# Patient Record
Sex: Female | Born: 1955 | Race: White | Hispanic: No | Marital: Married | Smoking: Current every day smoker
Health system: Southern US, Community
[De-identification: ages and names within clinical notes are randomized; demographics above are authoritative.]

## PROBLEM LIST (undated history)

## (undated) DIAGNOSIS — E871 Hypo-osmolality and hyponatremia: Secondary | ICD-10-CM

## (undated) DIAGNOSIS — E46 Unspecified protein-calorie malnutrition: Secondary | ICD-10-CM

## (undated) DIAGNOSIS — J449 Chronic obstructive pulmonary disease, unspecified: Secondary | ICD-10-CM

## (undated) DIAGNOSIS — G8929 Other chronic pain: Secondary | ICD-10-CM

## (undated) DIAGNOSIS — F419 Anxiety disorder, unspecified: Secondary | ICD-10-CM

## (undated) DIAGNOSIS — Z720 Tobacco use: Secondary | ICD-10-CM

## (undated) DIAGNOSIS — J45909 Unspecified asthma, uncomplicated: Secondary | ICD-10-CM

## (undated) DIAGNOSIS — J9610 Chronic respiratory failure, unspecified whether with hypoxia or hypercapnia: Secondary | ICD-10-CM

## (undated) DIAGNOSIS — M549 Dorsalgia, unspecified: Secondary | ICD-10-CM

## (undated) DIAGNOSIS — R042 Hemoptysis: Secondary | ICD-10-CM

## (undated) DIAGNOSIS — E039 Hypothyroidism, unspecified: Secondary | ICD-10-CM

## (undated) HISTORY — DX: Chronic obstructive pulmonary disease, unspecified: J44.9

## (undated) HISTORY — DX: Unspecified asthma, uncomplicated: J45.909

## (undated) HISTORY — DX: Tobacco use: Z72.0

## (undated) HISTORY — DX: Hypo-osmolality and hyponatremia: E87.1

## (undated) HISTORY — DX: Hypothyroidism, unspecified: E03.9

---

## 2007-07-13 ENCOUNTER — Inpatient Hospital Stay (HOSPITAL_COMMUNITY): Admission: EM | Admit: 2007-07-13 | Discharge: 2007-07-18 | Payer: Self-pay | Admitting: Emergency Medicine

## 2007-07-13 ENCOUNTER — Ambulatory Visit: Payer: Self-pay | Admitting: Internal Medicine

## 2007-07-13 ENCOUNTER — Ambulatory Visit: Payer: Self-pay | Admitting: Cardiology

## 2007-07-17 ENCOUNTER — Encounter: Payer: Self-pay | Admitting: Internal Medicine

## 2007-08-12 ENCOUNTER — Ambulatory Visit: Payer: Self-pay | Admitting: Internal Medicine

## 2007-08-12 DIAGNOSIS — F17200 Nicotine dependence, unspecified, uncomplicated: Secondary | ICD-10-CM

## 2007-08-12 DIAGNOSIS — E039 Hypothyroidism, unspecified: Secondary | ICD-10-CM

## 2007-08-12 DIAGNOSIS — I2510 Atherosclerotic heart disease of native coronary artery without angina pectoris: Secondary | ICD-10-CM

## 2007-08-12 DIAGNOSIS — D708 Other neutropenia: Secondary | ICD-10-CM

## 2007-08-12 DIAGNOSIS — J4489 Other specified chronic obstructive pulmonary disease: Secondary | ICD-10-CM | POA: Insufficient documentation

## 2007-08-12 DIAGNOSIS — J449 Chronic obstructive pulmonary disease, unspecified: Secondary | ICD-10-CM

## 2007-08-12 DIAGNOSIS — J45909 Unspecified asthma, uncomplicated: Secondary | ICD-10-CM | POA: Insufficient documentation

## 2007-08-12 LAB — CONVERTED CEMR LAB
BUN: 4 mg/dL — ABNORMAL LOW (ref 6–23)
Basophils Absolute: 0 10*3/uL (ref 0.0–0.1)
Basophils Relative: 0.6 % (ref 0.0–1.0)
CO2: 31 meq/L (ref 19–32)
Calcium: 9.4 mg/dL (ref 8.4–10.5)
Chloride: 99 meq/L (ref 96–112)
Creatinine, Ser: 0.6 mg/dL (ref 0.4–1.2)
Eosinophils Absolute: 0.2 10*3/uL (ref 0.0–0.6)
Eosinophils Relative: 3.2 % (ref 0.0–5.0)
GFR calc Af Amer: 137 mL/min
GFR calc non Af Amer: 113 mL/min
Glucose, Bld: 113 mg/dL — ABNORMAL HIGH (ref 70–99)
HCT: 39.8 % (ref 36.0–46.0)
Hemoglobin: 14.2 g/dL (ref 12.0–15.0)
Lymphocytes Relative: 25.3 % (ref 12.0–46.0)
MCHC: 35.6 g/dL (ref 30.0–36.0)
MCV: 93.9 fL (ref 78.0–100.0)
Monocytes Absolute: 0.5 10*3/uL (ref 0.2–0.7)
Monocytes Relative: 9.6 % (ref 3.0–11.0)
Neutro Abs: 2.8 10*3/uL (ref 1.4–7.7)
Neutrophils Relative %: 61.3 % (ref 43.0–77.0)
Platelets: 302 10*3/uL (ref 150–400)
Potassium: 5.1 meq/L (ref 3.5–5.1)
RBC: 4.24 M/uL (ref 3.87–5.11)
RDW: 12.4 % (ref 11.5–14.6)
Sodium: 137 meq/L (ref 135–145)
WBC: 4.7 10*3/uL (ref 4.5–10.5)

## 2007-08-13 ENCOUNTER — Telehealth: Payer: Self-pay | Admitting: Internal Medicine

## 2009-12-22 ENCOUNTER — Emergency Department (HOSPITAL_COMMUNITY): Admission: EM | Admit: 2009-12-22 | Discharge: 2009-12-22 | Payer: Self-pay | Admitting: Emergency Medicine

## 2009-12-29 ENCOUNTER — Telehealth: Payer: Self-pay | Admitting: Internal Medicine

## 2010-01-07 ENCOUNTER — Encounter: Payer: Self-pay | Admitting: Internal Medicine

## 2010-04-07 ENCOUNTER — Other Ambulatory Visit: Admission: RE | Admit: 2010-04-07 | Discharge: 2010-04-07 | Payer: Self-pay | Admitting: Otolaryngology

## 2010-04-13 ENCOUNTER — Encounter: Admission: RE | Admit: 2010-04-13 | Discharge: 2010-04-13 | Payer: Self-pay | Admitting: Otolaryngology

## 2010-05-13 ENCOUNTER — Ambulatory Visit (HOSPITAL_COMMUNITY): Admission: RE | Admit: 2010-05-13 | Discharge: 2010-05-14 | Payer: Self-pay | Admitting: Otolaryngology

## 2010-05-13 ENCOUNTER — Encounter (INDEPENDENT_AMBULATORY_CARE_PROVIDER_SITE_OTHER): Payer: Self-pay | Admitting: Otolaryngology

## 2010-08-30 NOTE — Progress Notes (Signed)
Summary: ? fu needed  Phone Note From Other Clinic   Summary of Call: not seen her in 2.5 years. GOt leet from [place] that she went there 12/22/2009 with cough. Pls call and see if she wants to come in and see me Initial call taken by: Kalman Shan MD,  December 29, 2009 5:52 PM  Follow-up for Phone Call        [facility]. Carron Curie CMA  December 30, 2009 3:45 PM ATC pt,pt answered at cell number but connection was bad. I tried to call back and there was no answer.  I looked pt up in Echart and the numbers are the same, but there is a local address for the patient. The address we have in EMR is an out of state address, so I will mail a letter to pt at [address] [place], [place] [address] asking her to call the office to set an appt. Carron Curie CMA  January 07, 2010 3:14 PM

## 2010-08-30 NOTE — Letter (Signed)
Summary: [facility]  [address]   [place], [place] [address]   Phone: [phone]  Fax: [phone]    01/07/2010  [facility] [address] [place], [place] [address]  Dear Ms. MOGLE,    We have been attempting to contact you to schedule an appointment to see Dr. Marchelle Gearing. We have been unable to reach you. Please call our office at your earliest convenience. Thank you.         Sincerely,   Nature conservation officer Pulmonary Division

## 2010-10-13 LAB — SURGICAL PCR SCREEN
MRSA, PCR: NEGATIVE
Staphylococcus aureus: NEGATIVE

## 2010-10-13 LAB — CBC
HCT: 41.6 % (ref 36.0–46.0)
Hemoglobin: 15 g/dL (ref 12.0–15.0)
MCH: 33.8 pg (ref 26.0–34.0)
MCHC: 36.1 g/dL — ABNORMAL HIGH (ref 30.0–36.0)
MCV: 93.7 fL (ref 78.0–100.0)
Platelets: 252 10*3/uL (ref 150–400)
RBC: 4.44 MIL/uL (ref 3.87–5.11)
RDW: 12.2 % (ref 11.5–15.5)
WBC: 6.8 10*3/uL (ref 4.0–10.5)

## 2010-10-13 LAB — BASIC METABOLIC PANEL
BUN: 4 mg/dL — ABNORMAL LOW (ref 6–23)
CO2: 29 mEq/L (ref 19–32)
Calcium: 9.5 mg/dL (ref 8.4–10.5)
Chloride: 94 mEq/L — ABNORMAL LOW (ref 96–112)
Creatinine, Ser: 0.55 mg/dL (ref 0.4–1.2)
GFR calc Af Amer: >60 mL/min (ref >60)
GFR calc non Af Amer: >60 mL/min (ref >60)
Glucose, Bld: 94 mg/dL (ref 70–99)
Potassium: 4.1 mEq/L (ref 3.5–5.1)
Sodium: 132 mEq/L — ABNORMAL LOW (ref 135–145)

## 2010-10-17 LAB — SAMPLE TO BLOOD BANK

## 2010-10-17 LAB — DIFFERENTIAL
Basophils Absolute: 0.1 10*3/uL (ref 0.0–0.1)
Basophils Relative: 1 % (ref 0–1)
Eosinophils Absolute: 0.1 10*3/uL (ref 0.0–0.7)
Eosinophils Relative: 2 % (ref 0–5)
Lymphocytes Relative: 14 % (ref 12–46)
Lymphs Abs: 1.2 10*3/uL (ref 0.7–4.0)
Monocytes Absolute: 0.7 10*3/uL (ref 0.1–1.0)
Monocytes Relative: 8 % (ref 3–12)
Neutro Abs: 6.9 10*3/uL (ref 1.7–7.7)
Neutrophils Relative %: 77 % (ref 43–77)

## 2010-10-17 LAB — CBC
HCT: 39.1 % (ref 36.0–46.0)
Hemoglobin: 13.5 g/dL (ref 12.0–15.0)
MCHC: 34.5 g/dL (ref 30.0–36.0)
MCV: 97.4 fL (ref 78.0–100.0)
Platelets: 397 10*3/uL (ref 150–400)
RBC: 4.01 MIL/uL (ref 3.87–5.11)
RDW: 13.7 % (ref 11.5–15.5)
WBC: 9 10*3/uL (ref 4.0–10.5)

## 2010-10-17 LAB — COMPREHENSIVE METABOLIC PANEL
ALT: 20 U/L (ref 0–35)
AST: 31 U/L (ref 0–37)
Albumin: 3.9 g/dL (ref 3.5–5.2)
Alkaline Phosphatase: 97 U/L (ref 39–117)
BUN: 4 mg/dL — ABNORMAL LOW (ref 6–23)
CO2: 29 mEq/L (ref 19–32)
Calcium: 9 mg/dL (ref 8.4–10.5)
Chloride: 93 mEq/L — ABNORMAL LOW (ref 96–112)
Creatinine, Ser: 0.52 mg/dL (ref 0.4–1.2)
GFR calc Af Amer: >60 mL/min (ref >60)
GFR calc non Af Amer: >60 mL/min (ref >60)
Glucose, Bld: 109 mg/dL — ABNORMAL HIGH (ref 70–99)
Potassium: 3.7 mEq/L (ref 3.5–5.1)
Sodium: 131 mEq/L — ABNORMAL LOW (ref 135–145)
Total Bilirubin: 0.5 mg/dL (ref 0.3–1.2)
Total Protein: 7.6 g/dL (ref 6.0–8.3)

## 2010-10-17 LAB — URINALYSIS, ROUTINE W REFLEX MICROSCOPIC
Bilirubin Urine: NEGATIVE
Glucose, UA: NEGATIVE mg/dL
Hgb urine dipstick: NEGATIVE
Ketones, ur: NEGATIVE mg/dL
Nitrite: NEGATIVE
Protein, ur: NEGATIVE mg/dL
Specific Gravity, Urine: 1.003 — ABNORMAL LOW (ref 1.005–1.030)
Urobilinogen, UA: 0.2 mg/dL (ref 0.0–1.0)
pH: 7 (ref 5.0–8.0)

## 2010-10-17 LAB — PROTIME-INR
INR: 0.96 (ref 0.00–1.49)
Prothrombin Time: 12.7 seconds (ref 11.6–15.2)

## 2010-10-17 LAB — APTT: aPTT: 28 seconds (ref 24–37)

## 2010-12-13 NOTE — H&P (Signed)
Terri Stephens, Terri Stephens               ACCOUNT NO.:  [id number]   MEDICAL RECORD NO.:  [id number]          PATIENT TYPE:  EMS   LOCATION:  ED                           FACILITY[facility]  [facility]   PHYSICIAN:  Thomasenia Bottoms, MDDATE OF BIRTH:  [birth year]   DATE OF ADMISSION:  07/13/2007  DATE OF DISCHARGE:                              HISTORY & PHYSICAL   CHIEF COMPLAINT:  Shortness of breath.   HISTORY OF PRESENT ILLNESS:  Terri Stephens is a 55 year old who presents  today with worsening shortness of breath over the last three days.  The  patient is a visitor here from [place].  She says earlier in the week  starting Monday she had some sinus trouble with headache and runny nose,  a little sore throat.  She has been having intermittent low fevers at  home.  Initially was just upper respiratory symptoms but about three  days ago she started having trouble with shortness of breath and that  has progressively gotten worse.  It is to the point where she could not  breathe at all.  The patient did have runny nose and some productive  cough about four days ago, but in the last 48 hours she has been  coughing, but nothing has been coming up.  The patient was diagnosed  with asthma three years ago, but has never been hospitalized for  wheezing or shortness of breath.   PAST MEDICAL HISTORY:  Significant for asthma and hypothyroidism.  Of  note, with the asthma she uses her albuterol inhaler multiple times  every day and gets quite short of breath without it.   SOCIAL HISTORY:  She does smoke cigarettes.  No alcohol or illicit  drugs.  She is unemployed at this time. 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   FAMILY HISTORY:  Significant for mother who died of emphysema and father  who had lung cancer.   PAST SURGICAL HISTORY:  The patient's only surgery is tonsillectomy, and  she has had no hospitalizations in her adult life.   MEDICATIONS:  Medications on arrival include Levoxyl 0.88 mg p.o. daily,  ProAir inhaler which she uses four times a day, Zoloft 100 mg daily in  the winter and 50 mg daily in the summer, Claritin D once daily.   REVIEW OF SYSTEMS:  CONSTITUTIONAL:  No weight loss.  No night sweats.  HEENT:  She has been having headaches the last week along with her runny  nose and sore throat.  Please see HPI for further details, no ringing in  her ears or difficulty swallowing. CARDIOVASCULAR:  She has a lot of  chest tightness and has had that for the last 48 hours, but no trouble  with chest pain exertional or otherwise prior to her episode of  wheezing.  No trouble with lower extremity edema.  RESPIRATORY:  Please  see HPI.  She has not had any hemoptysis.  She does have the regular  wheezing, however.  GI:  No diarrhea, constipation.  Has not vomited  blood or seen any blood in her stool.  GU:  No hematuria.  GYN:  The  patient is postmenopausal.  MUSCULOSKELETAL:  She denies any joint  pains.  INTEGUMENTARY:  She denies any rashes or open lesions.  HEMATOLOGIC:  She denies any trouble bruising easily.  NEUROLOGIC:  No  paresthesias.  No history of seizures.  No asymmetric weakness.  All  other systems reviewed and are negative.   PHYSICAL EXAMINATION:  VITAL SIGNS:  Her vitals on arrival her blood  pressure was 212/98, later without intervention it was 125/76,  temperature 97.4, pulse initially 116, respiratory rate 40, O2 sat 89%  on room air.  GENERAL:  The patient is tachypneic and thin with a somewhat barrel  chest.  HEENT:  Normocephalic, atraumatic.  Pupils are equal, round, and briskly  reactive to light direct and consensual, sclerae nonicteric.  Oral  mucosa and oropharynx moist.  NECK:  No lymphadenopathy, no thyromegaly, no jugular venous distention.  CARDIAC:  Tachycardiac but regular.  No murmurs are appreciated.  LUNGS:  Reveal very poor air movement.  She has an end inspiratory  squeak, no rales.  ABDOMEN:  Soft, nontender, nondistended.  Normoactive  bowel sounds.  No  masses are appreciated.  EXTREMITIES:  Reveal no evidence of clubbing, cyanosis or edema.  She  has palpable DP pulses bilaterally.  NEUROLOGICAL:  She is alert and  oriented x3.  She is appropriate.  Her cranial nerves II-XII are intact  grossly.  She has 5/5 strength in her upper and lower extremities.  Her  sensory exam is intact grossly in her upper and lower extremities.  She  has normal muscle tone and bulk joints reveal no evidence of effusion of  her joints.  SKIN:  Intact with no open lesions or rashes.   LABORATORY DATA:  White count 6.6, hemoglobin 16.6, hematocrit 47.3,  platelet count 258.  Sodium is 128, potassium 4.2, chloride 92, bicarb  26, glucose 112, BUN 4, creatinine 0.48.  Troponin less than 0.05.   Her chest x-ray reveals severe hyperinflation but no evidence of  pneumonia.   ASSESSMENT/PLAN:  1. Chronic obstructive pulmonary disease flare.  This patient says she      has asthma, but given the recent diagnosis of the wheezing in her      smoking history I suspect this is chronic obstructive pulmonary      disease rather than asthma.  It also appears that the patient      requires her inhaler every day.  She never has days where she is      not wheezing or short of breath without the inhaler.  Given her age      under 44 and the history of chronic obstructive pulmonary disease      in her family, I will go ahead and check an alpha-1 antitrypsin;      otherwise, will treat her with intravenous Solu-Medrol and      antibiotics and O2 as the patient was mildly hypoxemic on arrival.  2. Hyponatremia.  Will put the patient on intravenous fluids and      follow.  3. Elevated hemoglobin.  We will monitor this after she has been on      fluids to be sure that the patient does not have polycythemia vera.      She did not have any clubbing on physical examination, but I did      encourage the patient to quit.  4. Tobacco abuse.  The patient has  already had smoking cessation  counseling here in the emergency department, but she should have      this recurrently especially given the history of lung cancer in her      father and mother who died of emphysema.  5. Hypothyroidism.  We will continue her Levoxyl and for her      depression, we will continue her Zoloft.  We will check a thyroid-      stimulating hormone while she is here as well.      Thomasenia Bottoms, MD  Electronically Signed     CVC/MEDQ  D:  07/13/2007  T:  07/15/2007  Job:  [id number]

## 2010-12-13 NOTE — Consult Note (Signed)
Terri Stephens               ACCOUNT NO.:  [id number]   MEDICAL RECORD NO.:  [id number]          PATIENT TYPE:  INP   LOCATION:  1539                         FACILITY:  [facility]   PHYSICIAN:  Kalman Shan, MD   DATE OF BIRTH:  Jun 03, 1956   DATE OF CONSULTATION:  DATE OF DISCHARGE:                                 CONSULTATION   REQUESTING PHYSICIAN:  [facility]   REASON FOR CONSULTATION:  1. Chronic obstructive pulmonary disease exacerbation, evaluation.  2. 40 minute consult beginning at 4:30 p.m. on July 16, 2007.   CHIEF COMPLAINT:  COPD exacerbation.   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  She was visiting her father in [place] for the Thanksgiving  vacations.  She came here around June 10, 2007.  She states that  since moving down from [place] in terms of actually shortness of breath,  she was actually feeling better.  For the past 2-3 weeks prior to  admission on July 13, 2007, she was helping her sister-in-law who  had significant cockroach problems in her house.  The patient was  spraying a lot of RAID every night for at least 2 weeks, and then after  a 1 week break she most recently used it on July 07, 2007.  On te  same day she also was exposed to some sick kids, who were her nieces and  nephews.  Then on July 09, 2007, she started developing a cold and  started feeling unwell.  By July 10, 2007, she thought she was  getting bronchitis and developed cough and chest tightness.  Over the  course of the next few days, symptoms worsened to the extent that  July 13, 2007 she drove herself down to the ER.  She stated that she  was so significantly short of breath that she thought she was going to  die.  She was then triaged to the floor and was treated with  antibiotics, steroids, oxygen, and nebulizers.  Was not admitted to the  intensive care unit.  Did not receive BiPAP, was not intubated.  Since  admission on July 13, 2007 she now feels significantly better.  Shortness of breath is much improved.  She feels that she is 90% better  since admission, but still feels that she is 30% away from her baseline  status prior to November 2008.   PAST MEDICAL HISTORY:  Significant for:  1. Asthma.  This was diagnosed 5 years ago by her primary care      physician in [place], [place].  She has been given ProAir      inhalers.  Over the course for the past few years her ProAir      inhaler requirement has increased and she has never figured out      why.  2. Hypothyroidism on replacement.  3. Depression, on Zoloft.   SOCIAL HISTORY:  Originally from [place].  Has lived in [place] for  the last 20 years.  Is married, has no kids.  Her husband is in [place].  He only now knows about the  illness.  He too smokes.  The patient smoked  2-3 packs of cigarettes a day for the last 33 years.  The last cigarette  was July 13, 2007.  She says she is definitely quitting smoking.  She is a retired Agricultural engineer.  She quit working in 16-Dec-2000 when her  mom died.   EXPOSURE HISTORY:  1. Tobacco abuse as described.  2. Wood smoke from wood burning in [place].  High altitude in      [place], [place], and a lot of cold wind.  3. In addition she has remotely used marijuana.  4. Denies any cocaine exposure.   MEDICATIONS:  Home medications include ProAir, Zoloft, and Synthroid  replacement.  Current medications are prednisone, antibiotics,  nebulizers, Spiriva, and Advair, and Xanax.   ALLERGIES:  NO KNOWN ALLERGIES.   REVIEW OF SYSTEMS:  At baseline prior to admission, her effort tolerance  is limited to 25 feet.  Last walk 1 block many years ago.  In addition  there is baseline wheezing, chest tightness, cough, and white sputum  production.  Denies chest pain, paroxysmal nocturnal dyspnea, edema,  hemoptysis, orthopnea.   FAMILY HISTORY:  Mom died of COPD in 12-16-2000.  The patient had to  remove  her from the ventilator.  Dad has COPD and also has lung cancer.  Alpha  1 antitrypsin has not been checked in the family.  Mom also had stroke  and myocardial infarction.  The patient is the only child and has no  children.   PHYSICAL EXAMINATION:  VITAL SIGNS:  Temperature 98.1.  Pulse of 84.  Respiratory rate of 18.  Blood pressure 128/78.  Saturation 94% on 2  liters.  GENERAL:  A thin, tall female, seated comfortably.  CENTRAL NERVOUS SYSTEM:  Alert and oriented x3.  Speech and ambulation  normal.  MOOD:  Talks a lot, but no flight of ideas.  CARDIOVASCULAR:  Normal heart sounds.  Possible ejection systolic murmur  present.  RESPIRATORY:  Trachea central.  Air entry equal.  Prolonged expiration.  Bilateral end-expiratory wheeze.  Some crackles also present.  ABDOMEN:  Scaphoid, soft, no mass present.  Normal bowel sounds.  EXTREMITIES:  No cyanosis, no clubbing, no edema.  MUSCULOSKELETAL:  Joints look normal.  SKIN:  Skin is intact.   LABORATORY DATA:  1. Chest x-ray July 13, 2007 was essentially clear, but shows      presence of severe COPD.  2. Blood gas not available and not done.  3. Hematology, white count on July 13, 2007 was 6.6.  Hemoglobin      16.6.  Platelet count 258.  Interestingly, white count today is      lower to 2.7, unclear why.  4. Chemistries:  Today sodium 136, potassium 3.5, chloride 98.  BUN 7,      creatinine 0.5, bicarb of 34, calcium of 8.9.  5. Thyroid stimulating hormone on July 13, 2007 was normal.  6. Her cardiac enzymes on July 13, 2007 were negative.   ASSESSMENT AND PLAN:  In summary, Terri Stephens is a 4 year old  Caucasian female who presented with clinically severe chronic  obstructive pulmonary disease exacerbation.  She is being treated with  standard treatment of oxygen, nebulizers, steroids, and antibiotics, but  without the need of mechanical ventilation.  She has improved  significantly since admission.   Current issues deal mainly with chronic  obstructive pulmonary disease.  Issues are:  1.  Family history of  chronic obstructive pulmonary disease. 2.  Future treatment for chronic  obstructive pulmonary disease. 3. Tobacco Abuse   PLAN AND RECOMMENDATIONS:  1. I have written out a steroid taper for 2 weeks.  2. Continue current antibiotic therapy of moxifloxacin for at least 10      days in total.  3. Continue Spiriva as written, life long.  4. Continue Advair.  5. Health maintenance, appreciate giving the flu shot.  6. Health maintenance.  Would definitely recommend Pneumovax vaccine.  7. Refer to pulmonary rehabilitation.  8. In view of the ejection systolic murmur get a transthoracic echo.      Of note, pulmonary rehab is indicated even immediately after      discharge and is safe in this situation.  9. Will obtain blood gas and also some baseline spirometry prior to      discharge.  10.Encourage smoking remission   Follow up in clinic with myself as soon as possible.  I will continue to  follow this patient.      Kalman Shan, MD  Electronically Signed     MR/MEDQ  D:  07/16/2007  T:  07/17/2007  Job:  [id number]

## 2010-12-13 NOTE — Discharge Summary (Signed)
NAMESISSI, PADIA               ACCOUNT NO.:  [id number]   MEDICAL RECORD NO.:  [id number]          PATIENT TYPE:  INP   LOCATION:  1539                         FACILITY:  [facility]   PHYSICIAN:  Ladell Pier, M.D.   DATE OF BIRTH:  1956-04-11   DATE OF ADMISSION:  07/13/2007  DATE OF DISCHARGE:  07/19/2007                               DISCHARGE SUMMARY   DISCHARGE DIAGNOSES:  1. Chronic obstructive pulmonary disease exacerbation.  2. Asthma.  3. Hyponatremia.  4. Hypothyroidism.  5. Tobacco use.  6. Question of septal infarct on echocardiogram and also on      electrocardiogram.  7. Low white blood cell count.   DISCHARGE MEDICATIONS:  1. Prednisone taper.  2. Spiriva daily.  3. Advair b.i.d.  4. Avelox 400 mg daily for 10 days.  5. Levoxyl 488 mcg 88.  6. ProAir HFA p.r.n.  7. Zoloft 100 mg daily.  8. Claritin-D q.24h. p.r.n.   FOLLOW-UP APPOINTMENT:  The patient to follow up at [facility],  call Trish for appointment.  The patient also to follow up with  pulmonary.   CONSULTANTS:  Pulmonary.   PROCEDURES:  Pulmonary function tests showing moderate COPD.   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  She was visiting her father in  [place] for the Thanksgiving vacation.  She came here around November  10.  She states that since moving down from [place] in terms of actual  shortness of breath, she was actually feeling better.  For the past of 2-  3 3 weeks prior to admission she was helping her sister-in-law, who has  a significant cockroach problem in her house.  The patient was spraying  a lot of Raid every night for approximately 2 weeks.  Please see the  admission H&P for remainder of history, past medical history, family  history, social history, medicationss, allergies, review of systems per  admission H&P.   PHYSICAL EXAMINATION ON DISCHARGE:  Temperature 97.5, pulse of 53,  respirations 16,  blood pressure 105/51, pulse ox 95% on room air.  HEENT:  Head normocephalic, atraumatic.  Pupils reactive to light.  Throat without erythema.  CARDIOVASCULAR:  Regular rate and rhythm.  LUNGS:  Clear bilaterally.  ABDOMEN:  Positive bowel sounds.  EXTREMITIES:  No edema.   HOSPITAL COURSE:  Problem 1.  CHRONIC OBSTRUCTIVE PULMONARY DISEASE EXACERBATION:  The  patient was admitted to the hospital, placed on IV steroid and  antibiotic.  Over the course of her hospitalization her symptoms  improved.  She requested a pulmonary consult.  [facility] was  consulted, who recommended discharging her on prednisone taper, Avelox  and inhalers.  She will follow up with them outpatient.   Problem 2.  TOBACCO ABUSE:  Counseled on smoking cessation.   Problem 3.  ASTHMA:  As per above.  She is treated for COPD/asthma.   Problem 4.  ABNORMAL ELECTROCARDIOGRAM 2-D ECHOCARDIOGRAM:  She had a 2-  D echo done while inpatient that showed septal wall hypokinesis and EKG  just showed a question of septal  infarct.  The patient will follow up  with [facility] for further workup of these findings.   Problem 5.  DECREASED WHITE BLOOD CELL COUNT:  The patient had one lab  work that the white count was decreased.  She will get a repeat CBC  outpatient.  If it is still elevated, she will need further workup for  the leukopenia as her white count on admission was normal at 6.6.   DISCHARGE LABS:  Cardiac enzymes:  CK 39, MB 2.1, troponin 0.0.  Alpha-1  antitrypsin 171.  Sodium 136, potassium 3.5, chloride 98, CO2 34,  glucose 124, BUN 7, creatinine 0.47, calcium 8.9.  TSH of 0.759.  BNP of  31.  WBC 2.7, hemoglobin 14.3, platelets 234.   Chest x-ray showed severe COPD, no infiltrate.      Ladell Pier, M.D.  Electronically Signed     [place]/MEDQ  D:  07/18/2007  T:  07/19/2007  Job:  [id number]   cc:   [facility]   [facility]

## 2011-05-05 LAB — BLOOD GAS, ARTERIAL
Acid-Base Excess: 7.7 — ABNORMAL HIGH
Bicarbonate: 33 — ABNORMAL HIGH
Drawn by: 232811
FIO2: 0.21
O2 Saturation: 92.1
Patient temperature: 98.6
TCO2: 28.2
pCO2 arterial: 49 — ABNORMAL HIGH
pH, Arterial: 7.443 — ABNORMAL HIGH
pO2, Arterial: 63.9 — ABNORMAL LOW

## 2011-05-05 LAB — BASIC METABOLIC PANEL
BUN: 7
BUN: 7
CO2: 30
CO2: 34 — ABNORMAL HIGH
Calcium: 8.9
Calcium: 9.1
Chloride: 95 — ABNORMAL LOW
Chloride: 98
Creatinine, Ser: 0.43
Creatinine, Ser: 0.47
GFR calc Af Amer: >60
GFR calc Af Amer: >60
GFR calc non Af Amer: >60
GFR calc non Af Amer: >60
Glucose, Bld: 124 — ABNORMAL HIGH
Glucose, Bld: 125 — ABNORMAL HIGH
Potassium: 3.5
Potassium: 4
Sodium: 133 — ABNORMAL LOW
Sodium: 136

## 2011-05-05 LAB — CARDIAC PANEL(CRET KIN+CKTOT+MB+TROPI)
CK, MB: 2.1
Relative Index: INVALID
Total CK: 39
Troponin I: 0.03

## 2011-05-05 LAB — ALPHA-1-ANTITRYPSIN: A-1 Antitrypsin, Ser: 169

## 2011-05-08 LAB — BASIC METABOLIC PANEL
BUN: 4 — ABNORMAL LOW
BUN: 5 — ABNORMAL LOW
CO2: 26
CO2: 30
Calcium: 9.1
Calcium: 9.5
Chloride: 91 — ABNORMAL LOW
Chloride: 92 — ABNORMAL LOW
Creatinine, Ser: 0.48
Creatinine, Ser: 0.51
GFR calc Af Amer: >60
GFR calc Af Amer: >60
GFR calc non Af Amer: >60
GFR calc non Af Amer: >60
Glucose, Bld: 112 — ABNORMAL HIGH
Glucose, Bld: 139 — ABNORMAL HIGH
Potassium: 4.2
Potassium: 4.4
Sodium: 128 — ABNORMAL LOW
Sodium: 128 — ABNORMAL LOW

## 2011-05-08 LAB — CBC
HCT: 44.1
HCT: 47.3 — ABNORMAL HIGH
Hemoglobin: 14.3
Hemoglobin: 16.6 — ABNORMAL HIGH
MCHC: 32.5
MCHC: 35
MCV: 93.9
MCV: 94.8
Platelets: 234
Platelets: 258
RBC: 4.66
RBC: 5.03
RDW: 12.6
RDW: 13.5
WBC: 2.7 — ABNORMAL LOW
WBC: 6.6

## 2011-05-08 LAB — DIFFERENTIAL
Basophils Absolute: 0
Basophils Relative: 0
Eosinophils Absolute: 0 — ABNORMAL LOW
Eosinophils Relative: 1
Lymphocytes Relative: 17
Lymphs Abs: 1.1
Monocytes Absolute: 0.7
Monocytes Relative: 11
Neutro Abs: 4.7
Neutrophils Relative %: 71

## 2011-05-08 LAB — B-NATRIURETIC PEPTIDE (CONVERTED LAB): Pro B Natriuretic peptide (BNP): 31.1

## 2011-05-08 LAB — ALPHA-1 ANTITRYPSIN PHENOTYPE: A-1 Antitrypsin: 171 mg/dL (ref 100–200)

## 2011-05-08 LAB — POCT CARDIAC MARKERS
CKMB, poc: 7.5
Myoglobin, poc: 90
Operator id: 4074
Troponin i, poc: <0.05

## 2011-05-08 LAB — TSH: TSH: 0.759

## 2013-11-18 ENCOUNTER — Other Ambulatory Visit: Payer: Self-pay | Admitting: Physician Assistant

## 2013-11-18 ENCOUNTER — Ambulatory Visit
Admission: RE | Admit: 2013-11-18 | Discharge: 2013-11-18 | Disposition: A | Discharge status: Home / Self Care | Payer: BC Managed Care – HMO | Source: Ambulatory Visit | Attending: Physician Assistant | Admitting: Physician Assistant

## 2013-11-18 DIAGNOSIS — J449 Chronic obstructive pulmonary disease, unspecified: Secondary | ICD-10-CM

## 2013-11-18 DIAGNOSIS — R05 Cough: Secondary | ICD-10-CM

## 2013-11-18 DIAGNOSIS — R059 Cough, unspecified: Secondary | ICD-10-CM

## 2014-09-29 HISTORY — PX: CARDIAC CATHETERIZATION: SHX172

## 2015-05-24 ENCOUNTER — Institutional Professional Consult (permissible substitution): Payer: Self-pay | Admitting: Pulmonary Disease

## 2015-06-03 ENCOUNTER — Encounter: Payer: Self-pay | Admitting: Pulmonary Disease

## 2015-06-03 ENCOUNTER — Inpatient Hospital Stay (HOSPITAL_COMMUNITY): Payer: BLUE CROSS/BLUE SHIELD

## 2015-06-03 ENCOUNTER — Encounter (HOSPITAL_COMMUNITY): Payer: Self-pay | Admitting: *Deleted

## 2015-06-03 ENCOUNTER — Ambulatory Visit (INDEPENDENT_AMBULATORY_CARE_PROVIDER_SITE_OTHER): Payer: BLUE CROSS/BLUE SHIELD | Admitting: Pulmonary Disease

## 2015-06-03 ENCOUNTER — Inpatient Hospital Stay (HOSPITAL_COMMUNITY)
Admission: AD | Admit: 2015-06-03 | Discharge: 2015-06-06 | DRG: 190 | Disposition: A | Discharge status: Home / Self Care | Payer: BLUE CROSS/BLUE SHIELD | Source: Ambulatory Visit | Attending: Pulmonary Disease | Admitting: Pulmonary Disease

## 2015-06-03 DIAGNOSIS — E43 Unspecified severe protein-calorie malnutrition: Secondary | ICD-10-CM | POA: Insufficient documentation

## 2015-06-03 DIAGNOSIS — J441 Chronic obstructive pulmonary disease with (acute) exacerbation: Principal | ICD-10-CM | POA: Diagnosis present

## 2015-06-03 DIAGNOSIS — T17908A Unspecified foreign body in respiratory tract, part unspecified causing other injury, initial encounter: Secondary | ICD-10-CM

## 2015-06-03 DIAGNOSIS — J9621 Acute and chronic respiratory failure with hypoxia: Secondary | ICD-10-CM | POA: Diagnosis present

## 2015-06-03 DIAGNOSIS — F419 Anxiety disorder, unspecified: Secondary | ICD-10-CM | POA: Diagnosis present

## 2015-06-03 DIAGNOSIS — F17210 Nicotine dependence, cigarettes, uncomplicated: Secondary | ICD-10-CM | POA: Diagnosis present

## 2015-06-03 DIAGNOSIS — R042 Hemoptysis: Secondary | ICD-10-CM

## 2015-06-03 DIAGNOSIS — J439 Emphysema, unspecified: Secondary | ICD-10-CM

## 2015-06-03 LAB — CBC WITH DIFFERENTIAL/PLATELET
Basophils Absolute: 0 10*3/uL (ref 0.0–0.1)
Basophils Relative: 0 %
EOS ABS: 0.2 10*3/uL (ref 0.0–0.7)
EOS PCT: 2 %
HCT: 36.1 % (ref 36.0–46.0)
HEMOGLOBIN: 12.1 g/dL (ref 12.0–15.0)
LYMPHS ABS: 3.4 10*3/uL (ref 0.7–4.0)
Lymphocytes Relative: 28 %
MCH: 31.5 pg (ref 26.0–34.0)
MCHC: 33.5 g/dL (ref 30.0–36.0)
MCV: 94 fL (ref 78.0–100.0)
Monocytes Absolute: 0.8 10*3/uL (ref 0.1–1.0)
Monocytes Relative: 6 %
NEUTROS PCT: 64 %
Neutro Abs: 7.6 10*3/uL (ref 1.7–7.7)
PLATELETS: 325 10*3/uL (ref 150–400)
RBC: 3.84 MIL/uL — AB (ref 3.87–5.11)
RDW: 12.8 % (ref 11.5–15.5)
WBC: 11.9 10*3/uL — AB (ref 4.0–10.5)

## 2015-06-03 LAB — COMPREHENSIVE METABOLIC PANEL
ALT: 18 U/L (ref 14–54)
AST: 21 U/L (ref 15–41)
Albumin: 3.9 g/dL (ref 3.5–5.0)
Alkaline Phosphatase: 73 U/L (ref 38–126)
Anion gap: 6 (ref 5–15)
BUN: 9 mg/dL (ref 6–20)
CALCIUM: 9.2 mg/dL (ref 8.9–10.3)
CO2: 37 mmol/L — ABNORMAL HIGH (ref 22–32)
CREATININE: 0.46 mg/dL (ref 0.44–1.00)
Chloride: 91 mmol/L — ABNORMAL LOW (ref 101–111)
GFR calc non Af Amer: >60 mL/min (ref >60)
Glucose, Bld: 123 mg/dL — ABNORMAL HIGH (ref 65–99)
Potassium: 4.1 mmol/L (ref 3.5–5.1)
SODIUM: 134 mmol/L — AB (ref 135–145)
Total Bilirubin: 0.2 mg/dL — ABNORMAL LOW (ref 0.3–1.2)
Total Protein: 7 g/dL (ref 6.5–8.1)

## 2015-06-03 LAB — PROTIME-INR
INR: 0.94 (ref 0.00–1.49)
Prothrombin Time: 12.8 seconds (ref 11.6–15.2)

## 2015-06-03 LAB — APTT: APTT: 29 s (ref 24–37)

## 2015-06-03 LAB — BRAIN NATRIURETIC PEPTIDE: B NATRIURETIC PEPTIDE 5: 57.1 pg/mL (ref 0.0–100.0)

## 2015-06-03 LAB — TSH: TSH: 1.847 u[IU]/mL (ref 0.350–4.500)

## 2015-06-03 MED ORDER — DEXTROSE 5 % IV SOLN
1.0000 g | INTRAVENOUS | Status: DC
  Administered 2015-06-03 – 2015-06-05 (×3): 1 g via INTRAVENOUS
  Filled 2015-06-03 (×4): qty 10

## 2015-06-03 MED ORDER — SODIUM CHLORIDE 0.9 % IJ SOLN
3.0000 mL | Freq: Two times a day (BID) | INTRAMUSCULAR | Status: DC
Start: 2015-06-03 — End: 2015-06-04

## 2015-06-03 MED ORDER — SODIUM CHLORIDE 0.9 % IJ SOLN: 3.0000 mL | INTRAMUSCULAR | Status: DC | PRN

## 2015-06-03 MED ORDER — ACETAMINOPHEN 325 MG PO TABS: 650.0000 mg | ORAL_TABLET | Freq: Four times a day (QID) | ORAL | Status: DC | PRN

## 2015-06-03 MED ORDER — GUAIFENESIN 200 MG PO TABS
400.0000 mg | ORAL_TABLET | Freq: Two times a day (BID) | ORAL | Status: DC
  Administered 2015-06-03 – 2015-06-05 (×4): 400 mg via ORAL
  Filled 2015-06-03 (×8): qty 2

## 2015-06-03 MED ORDER — LEVOTHYROXINE SODIUM 50 MCG PO TABS: 50.0000 ug | ORAL_TABLET | Freq: Every day | ORAL | Status: DC

## 2015-06-03 MED ORDER — ONDANSETRON HCL 4 MG/2ML IJ SOLN
4.0000 mg | Freq: Four times a day (QID) | INTRAMUSCULAR | Status: DC | PRN
  Administered 2015-06-04: 4 mg via INTRAVENOUS
  Filled 2015-06-03: qty 2

## 2015-06-03 MED ORDER — IPRATROPIUM-ALBUTEROL 0.5-2.5 (3) MG/3ML IN SOLN
3.0000 mL | Freq: Four times a day (QID) | RESPIRATORY_TRACT | Status: DC
  Administered 2015-06-03 – 2015-06-04 (×3): 3 mL via RESPIRATORY_TRACT
  Filled 2015-06-03 (×3): qty 3

## 2015-06-03 MED ORDER — SODIUM CHLORIDE 0.9 % IJ SOLN
3.0000 mL | Freq: Two times a day (BID) | INTRAMUSCULAR | Status: DC
Start: 2015-06-03 — End: 2015-06-04
  Administered 2015-06-03 – 2015-06-04 (×2): 3 mL via INTRAVENOUS

## 2015-06-03 MED ORDER — ALBUTEROL SULFATE (2.5 MG/3ML) 0.083% IN NEBU: 2.5000 mg | INHALATION_SOLUTION | Freq: Four times a day (QID) | RESPIRATORY_TRACT | Status: DC

## 2015-06-03 MED ORDER — NICOTINE 21 MG/24HR TD PT24
21.0000 mg | MEDICATED_PATCH | Freq: Every day | TRANSDERMAL | Status: DC
  Administered 2015-06-03 – 2015-06-06 (×4): 21 mg via TRANSDERMAL
  Filled 2015-06-03 (×4): qty 1

## 2015-06-03 MED ORDER — ALBUTEROL SULFATE (2.5 MG/3ML) 0.083% IN NEBU
2.5000 mg | INHALATION_SOLUTION | RESPIRATORY_TRACT | Status: DC | PRN
  Administered 2015-06-04 – 2015-06-06 (×3): 2.5 mg via RESPIRATORY_TRACT
  Filled 2015-06-03 (×3): qty 3

## 2015-06-03 MED ORDER — IPRATROPIUM BROMIDE 0.02 % IN SOLN: 0.5000 mg | Freq: Four times a day (QID) | RESPIRATORY_TRACT | Status: DC

## 2015-06-03 MED ORDER — LEVOTHYROXINE SODIUM 50 MCG PO TABS
50.0000 ug | ORAL_TABLET | Freq: Every day | ORAL | Status: DC
  Administered 2015-06-04 – 2015-06-06 (×3): 50 ug via ORAL
  Filled 2015-06-03 (×3): qty 1

## 2015-06-03 MED ORDER — PANTOPRAZOLE SODIUM 40 MG PO TBEC
40.0000 mg | DELAYED_RELEASE_TABLET | Freq: Every day | ORAL | Status: DC
  Administered 2015-06-03 – 2015-06-06 (×4): 40 mg via ORAL
  Filled 2015-06-03 (×4): qty 1

## 2015-06-03 MED ORDER — SERTRALINE HCL 100 MG PO TABS
100.0000 mg | ORAL_TABLET | Freq: Every day | ORAL | Status: DC
  Administered 2015-06-04 – 2015-06-06 (×3): 100 mg via ORAL
  Filled 2015-06-03 (×4): qty 1

## 2015-06-03 MED ORDER — ACETAMINOPHEN 650 MG RE SUPP: 650.0000 mg | Freq: Four times a day (QID) | RECTAL | Status: DC | PRN

## 2015-06-03 MED ORDER — ONDANSETRON HCL 4 MG PO TABS: 4.0000 mg | ORAL_TABLET | Freq: Four times a day (QID) | ORAL | Status: DC | PRN

## 2015-06-03 MED ORDER — ENSURE ENLIVE PO LIQD
237.0000 mL | Freq: Two times a day (BID) | ORAL | Status: DC
  Administered 2015-06-04: 237 mL via ORAL

## 2015-06-03 MED ORDER — BUDESONIDE 0.25 MG/2ML IN SUSP
0.2500 mg | Freq: Two times a day (BID) | RESPIRATORY_TRACT | Status: DC
  Administered 2015-06-03 – 2015-06-05 (×4): 0.25 mg via RESPIRATORY_TRACT
  Filled 2015-06-03 (×4): qty 2

## 2015-06-03 MED ORDER — ENOXAPARIN SODIUM 40 MG/0.4ML ~~LOC~~ SOLN
40.0000 mg | SUBCUTANEOUS | Status: DC
  Administered 2015-06-03 – 2015-06-05 (×3): 40 mg via SUBCUTANEOUS
  Filled 2015-06-03 (×3): qty 0.4

## 2015-06-03 MED ORDER — DEXTROSE 5 % IV SOLN
500.0000 mg | INTRAVENOUS | Status: DC
  Administered 2015-06-04 – 2015-06-05 (×2): 500 mg via INTRAVENOUS
  Filled 2015-06-03 (×2): qty 500

## 2015-06-03 MED ORDER — ALPRAZOLAM 0.25 MG PO TABS: 0.2500 mg | ORAL_TABLET | Freq: Two times a day (BID) | ORAL | Status: DC | PRN

## 2015-06-03 MED ORDER — ALPRAZOLAM 0.25 MG PO TABS
0.2500 mg | ORAL_TABLET | Freq: Three times a day (TID) | ORAL | Status: DC | PRN
  Administered 2015-06-03 – 2015-06-06 (×5): 0.25 mg via ORAL
  Filled 2015-06-03 (×5): qty 1

## 2015-06-03 MED ORDER — METHYLPREDNISOLONE SODIUM SUCC 125 MG IJ SOLR
80.0000 mg | Freq: Three times a day (TID) | INTRAMUSCULAR | Status: DC
  Administered 2015-06-03 – 2015-06-04 (×2): 80 mg via INTRAVENOUS
  Filled 2015-06-03 (×2): qty 2

## 2015-06-03 MED ORDER — SODIUM CHLORIDE 0.9 % IV SOLN: 250.0000 mL | INTRAVENOUS | Status: DC | PRN

## 2015-06-03 NOTE — Progress Notes (Signed)
Called to assess patient due to EKG that was done for admission. EKG shows ST ELEVATION in multiple leads: LEADS 2, 3,  V3-V6. Patient warm and dry no chest pain or pain anywhere. She states she is fine and had an episode in March in [place]. She had gone to the ER with respiratory distress and upon getting an EKG they sent her for an emergent heart cath that was clear. Her lungs are decreased, on 3L/. Notified E-link of EKG and patient history.

## 2015-06-03 NOTE — Progress Notes (Signed)
eLink Physician-Brief Progress Note Patient Name: Terri Stephens DOB: 03/11/1956 MRN: [id number]   Date of Service  06/03/2015  HPI/Events of Note  Patient requests a Nicotine patch. Has Hx of tobacco abuse.   eICU Interventions  Will order Nicotine patch 21 mg Q day.     Intervention Category Minor Interventions: Routine modifications to care plan (e.g. PRN medications for pain, fever)  Samariyah Cowles Eugene 06/03/2015, 9:54 PM

## 2015-06-03 NOTE — Progress Notes (Signed)
59 year old with very severe COPD. GOLD Stage IV. Has worsening symptoms of dyspnea on exertion, wheezing, cough productive of sputum. She started having hemoptysis in the pulmonary office today and was admitted for further evaluation.  See H&P admit note from today for further details.  Chilton Greathouse MD [facility] Pager [phone] If no answer or after 3pm call: [phone] 06/03/2015, 5:41 PM

## 2015-06-03 NOTE — H&P (Signed)
PULMONARY / CRITICAL CARE MEDICINE   Name: Terri Stephens MRN: [id number] DOB: 07-27-1956    ADMISSION DATE: 06/03/15  CONSULTATION DATE:  06/03/15   CHIEF COMPLAINT: COPD exacerbation, hemoptysis.  INITIAL PRESENTATION: 59 year old with very severe COPD. GOLD Stage IV. Has worsening symptoms of dyspnea on exertion, wheezing, cough productive of sputum. She started having hemoptysis in the pulmonary office today and was admitted for further evaluation.  STUDIES:  CXR 11/3 >>  SIGNIFICANT EVENTS: 11/3 Admit with AECOPD, hemoptysis.  HISTORY OF PRESENT ILLNESS: Terri Stephens is a 56 year old with very severe COPD. GOLD Stage IV. She was seen at [facility] pulmonary in 2008 by Dr. Marchelle Gearing. She was then lost to follow-up since she moved to [place]. She had been maintained on Advair and Spiriva. She had an admission in [place] on 10/07/14 for an acute exacerbation of COPD. She was treated with antibiotics and steroids. She also underwent left heart catheterization for elevated troponins which did not show any coronary artery disease.   Since her discharge she has felt progressively worse with increasing dyspnea on exertion. It has reached a point where she cannot even walk across the room without having to stop. She moved to [facility] to resume pulmonary care as she felt she wasn't getting proper care at [facility]. In office today she was markedly dyspneic with wheezing. She started having cough with blood stained sputum production.  PFTs [07/17/07] FVC 1.66 [46%] FEV1 0.68 [23%] F/F 40 TLC 118% DLCO 47%  PAST MEDICAL HISTORY :   has a past medical history of COPD (chronic obstructive pulmonary disease) (HCC); Asthma; Hypothyroidism; Hyponatremia; and Tobacco abuse.  has no past surgical history on file. Prior to Admission medications   Medication Sig Start Date End Date Taking? Authorizing Provider  albuterol (PROAIR HFA) 108 (90 BASE) MCG/ACT inhaler Inhale 2 puffs into the lungs every 6  (six) hours as needed.    Historical Provider, MD  ALPRAZolam Prudy Feeler) 0.25 MG tablet Take 0.25 mg by mouth 3 (three) times daily as needed. 10/13/14   Historical Provider, MD  aspirin EC 81 MG tablet Take 81 mg by mouth daily.    Historical Provider, MD  Fluticasone-Salmeterol (ADVAIR DISKUS) 250-50 MCG/DOSE AEPB Inhale 1 puff into the lungs 2 (two) times daily.    Historical Provider, MD  guaifenesin (HUMIBID E) 400 MG TABS tablet Take 800 mg by mouth 4 (four) times daily.    Historical Provider, MD  ipratropium-albuterol (DUONEB) 0.5-2.5 (3) MG/3ML SOLN Inhale 3 mLs into the lungs every 6 (six) hours as needed.    Historical Provider, MD  levothyroxine (SYNTHROID, LEVOTHROID) 50 MCG tablet Take 50 mcg by mouth daily.    Historical Provider, MD  Multiple Vitamins-Minerals (MULTIVITAMIN & MINERAL PO) Take 1 tablet by mouth daily.    Historical Provider, MD  sertraline (ZOLOFT) 100 MG tablet Take 100 mg by mouth daily.    Historical Provider, MD  tiotropium (SPIRIVA) 18 MCG inhalation capsule Place 18 mcg into inhaler and inhale daily.    Historical Provider, MD  valACYclovir (VALTREX) 1000 MG tablet Take 1,000 mg by mouth as needed.    Historical Provider, MD   Allergies  Allergen Reactions  . Bee Venom Anaphylaxis  . Oxycodone Hcl Shortness Of Breath, Itching and Swelling    REACTION: All "codones" in any form    FAMILY HISTORY:  has no family status information on file.  SOCIAL HISTORY:  reports that she has been smoking Cigarettes.  She has a 20 pack-year smoking history. She does  not have any smokeless tobacco history on file. She reports that she drinks alcohol.  REVIEW OF SYSTEMS:  Has severe dyspnea on exertion, cough productive of sputum, hemoptysis.  Denies any fever, chills, malaise, weight loss, loss of appetite. Denies any chest pain, palpitations. Denies any nausea, vomiting, diarrhea, constipation. All other review of systems negative.  SUBJECTIVE:   VITAL  SIGNS: Stable   HEMODYNAMICS:   VENTILATOR SETTINGS:   INTAKE / OUTPUT: No intake or output data in the 24 hours ending 06/03/15 1649  PHYSICAL EXAMINATION: Gen.: Pleasant elderly female.Moderate respiratory distress. Neuro: No gross focal deficits. Neck: No JVD, lymphadenopathy, thyromegaly. RS: Distant breath sounds, faint bilateral expiratory wheezes CVS: S1-S2 heard, no murmurs rubs gallops. Abdomen: Soft, positive bowel sounds. Extremities: No edema.  LABS:  CBC No results for input(s): WBC, HGB, HCT, PLT in the last 168 hours. Coag's No results for input(s): APTT, INR in the last 168 hours. BMET No results for input(s): NA, K, CL, CO2, BUN, CREATININE, GLUCOSE in the last 168 hours. Electrolytes No results for input(s): CALCIUM, MG, PHOS in the last 168 hours. Sepsis Markers No results for input(s): LATICACIDVEN, PROCALCITON, O2SATVEN in the last 168 hours. ABG No results for input(s): PHART, PCO2ART, PO2ART in the last 168 hours. Liver Enzymes No results for input(s): AST, ALT, ALKPHOS, BILITOT, ALBUMIN in the last 168 hours. Cardiac Enzymes No results for input(s): TROPONINI, PROBNP in the last 168 hours. Glucose No results for input(s): GLUCAP in the last 168 hours.  Imaging No results found.   ASSESSMENT / PLAN:  PULMONARY OETT A: COPD with acute exacerbation. Hemoptysis P:   Admitted from clinic to [facility] for monitoring. Chest x-ray Check coags Start ceftriaxone and azithromycin. Solu-Medrol IV 125 mg every 8.  GASTROINTESTINAL A:  P:   PO diet  INFECTIOUS A:   Rule out pneumonia  P:   BCx2 Pending UC  Sputum Pending Abx:  Ceftriaxone, start date 06/03/15, day  Azithromycin, start date 06/03/15, day   Follow cultures Check Procalcitonin  ENDOCRINE A:  Hypothyroidism P:   Check TSH. Continue Synthroid  NEUROLOGIC A: Severe anxiety P:   Xanax when necessary   FAMILY  - Updates:  - Inter-disciplinary family  meet or Palliative Care meeting due by:  06/10/15  TODAY'S SUMMARY:   Terri Stephens Pulmonary and Critical Care Pager [phone] If no answer or after 3pm call: [phone] 06/03/2015, 4:49 PM

## 2015-06-03 NOTE — Progress Notes (Signed)
Pt admitted from 3rd floor for telemetry for COPD excaberation. Pt alert and orientated, resp labored but even and symmetrical. O2 stat 100% on 2L, BP stable. Terri Stephens

## 2015-06-04 ENCOUNTER — Inpatient Hospital Stay (HOSPITAL_COMMUNITY): Payer: BLUE CROSS/BLUE SHIELD

## 2015-06-04 LAB — RENAL FUNCTION PANEL
ALBUMIN: 3.6 g/dL (ref 3.5–5.0)
ANION GAP: 7 (ref 5–15)
BUN: 11 mg/dL (ref 6–20)
CALCIUM: 9.1 mg/dL (ref 8.9–10.3)
CO2: 35 mmol/L — ABNORMAL HIGH (ref 22–32)
Chloride: 91 mmol/L — ABNORMAL LOW (ref 101–111)
Creatinine, Ser: 0.43 mg/dL — ABNORMAL LOW (ref 0.44–1.00)
GFR calc Af Amer: >60 mL/min (ref >60)
Glucose, Bld: 150 mg/dL — ABNORMAL HIGH (ref 65–99)
PHOSPHORUS: 4.1 mg/dL (ref 2.5–4.6)
POTASSIUM: 4.8 mmol/L (ref 3.5–5.1)
SODIUM: 133 mmol/L — AB (ref 135–145)

## 2015-06-04 LAB — URINALYSIS, ROUTINE W REFLEX MICROSCOPIC
Bilirubin Urine: NEGATIVE
Glucose, UA: NEGATIVE mg/dL
Hgb urine dipstick: NEGATIVE
KETONES UR: NEGATIVE mg/dL
LEUKOCYTES UA: NEGATIVE
NITRITE: NEGATIVE
Protein, ur: NEGATIVE mg/dL
Specific Gravity, Urine: 1.007 (ref 1.005–1.030)
Urobilinogen, UA: 0.2 mg/dL (ref 0.0–1.0)
pH: 6.5 (ref 5.0–8.0)

## 2015-06-04 LAB — TROPONIN I
Troponin I: <0.03 ng/mL (ref <0.031)
Troponin I: <0.03 ng/mL (ref <0.031)

## 2015-06-04 MED ORDER — ADULT MULTIVITAMIN LIQUID CH
5.0000 mL | Freq: Every day | ORAL | Status: DC
  Administered 2015-06-04 – 2015-06-06 (×3): 5 mL via ORAL
  Filled 2015-06-04 (×3): qty 5

## 2015-06-04 MED ORDER — ARFORMOTEROL TARTRATE 15 MCG/2ML IN NEBU
15.0000 ug | INHALATION_SOLUTION | Freq: Two times a day (BID) | RESPIRATORY_TRACT | Status: DC
  Administered 2015-06-04 – 2015-06-05 (×2): 15 ug via RESPIRATORY_TRACT
  Filled 2015-06-04 (×2): qty 2

## 2015-06-04 MED ORDER — TIOTROPIUM BROMIDE MONOHYDRATE 18 MCG IN CAPS
18.0000 ug | ORAL_CAPSULE | Freq: Every day | RESPIRATORY_TRACT | Status: DC
  Administered 2015-06-05 – 2015-06-06 (×2): 18 ug via RESPIRATORY_TRACT
  Filled 2015-06-04: qty 5

## 2015-06-04 MED ORDER — PRO-STAT SUGAR FREE PO LIQD
30.0000 mL | Freq: Two times a day (BID) | ORAL | Status: DC
  Administered 2015-06-04 – 2015-06-06 (×3): 30 mL via ORAL
  Filled 2015-06-04 (×5): qty 30

## 2015-06-04 MED ORDER — METHYLPREDNISOLONE SODIUM SUCC 125 MG IJ SOLR
60.0000 mg | Freq: Two times a day (BID) | INTRAMUSCULAR | Status: DC
  Administered 2015-06-04 – 2015-06-05 (×2): 60 mg via INTRAVENOUS
  Filled 2015-06-04 (×2): qty 2

## 2015-06-04 MED ORDER — BOOST PLUS PO LIQD
237.0000 mL | Freq: Three times a day (TID) | ORAL | Status: DC
  Administered 2015-06-04 – 2015-06-06 (×4): 237 mL via ORAL
  Filled 2015-06-04 (×7): qty 237

## 2015-06-04 NOTE — Progress Notes (Signed)
PULMONARY / CRITICAL CARE MEDICINE   Name: Terri Stephens MRN: [id number] DOB: 1956-01-10    ADMISSION DATE: 06/03/15  CONSULTATION DATE:  06/03/15   CHIEF COMPLAINT: COPD exacerbation, hemoptysis.  INITIAL PRESENTATION: 59 year old with very severe COPD. GOLD Stage IV. Has worsening symptoms of dyspnea on exertion, wheezing, cough productive of sputum. She started having hemoptysis in the pulmonary office today and was admitted for further evaluation.  STUDIES:  CXR 11/3 >>  SIGNIFICANT EVENTS: 11/3 Admit with AECOPD, hemoptysis.  PFTs [07/17/07] FVC 1.66 [46%] FEV1 0.68 [23%] F/F 40 TLC 118% DLCO 47%  SUBJECTIVE:   VITAL SIGNS: Stable Temp:  [97.7 F (36.5 C)-98 F (36.7 C)] 97.7 F (36.5 C) (11/04 0502) Pulse Rate:  [66-96] 66 (11/04 0502) Resp:  [19-28] 20 (11/04 0502) BP: (105-146)/(59-82) 105/67 mmHg (11/04 0502) SpO2:  [97 %-100 %] 98 % (11/04 0758) Weight:  [52.708 kg (116 lb 3.2 oz)-54.5 kg (120 lb 2.4 oz)] 52.708 kg (116 lb 3.2 oz) (11/04 4098) HEMODYNAMICS:   VENTILATOR SETTINGS:   INTAKE / OUTPUT:  Intake/Output Summary (Last 24 hours) at 06/04/15 1058 Last data filed at 06/04/15 0941  Gross per 24 hour  Intake    893 ml  Output   1200 ml  Net   -307 ml    PHYSICAL EXAMINATION: Gen.: Pleasant elderly female.Moderate respiratory distress. Neuro: No gross focal deficits. Neck: No JVD, lymphadenopathy, thyromegaly. RS: Distant breath sounds, faint bilateral expiratory wheezes, no accessory muscle use  CVS: S1-S2 heard, no murmurs rubs gallops. Abdomen: Soft, positive bowel sounds. Extremities: No edema.  LABS:  CBC  Recent Labs Lab 06/03/15 1956  WBC 11.9*  HGB 12.1  HCT 36.1  PLT 325   Coag's  Recent Labs Lab 06/03/15 1956  APTT 29  INR 0.94   BMET  Recent Labs Lab 06/03/15 1956 06/04/15 0524  NA 134* 133*  K 4.1 4.8  CL 91* 91*  CO2 37* 35*  BUN 9 11  CREATININE 0.46 0.43*  GLUCOSE 123* 150*    Electrolytes  Recent Labs Lab 06/03/15 1956 06/04/15 0524  CALCIUM 9.2 9.1  PHOS  --  4.1   Sepsis Markers No results for input(s): LATICACIDVEN, PROCALCITON, O2SATVEN in the last 168 hours. ABG No results for input(s): PHART, PCO2ART, PO2ART in the last 168 hours. Liver Enzymes  Recent Labs Lab 06/03/15 1956 06/04/15 0524  AST 21  --   ALT 18  --   ALKPHOS 73  --   BILITOT 0.2*  --   ALBUMIN 3.9 3.6   Cardiac Enzymes  Recent Labs Lab 06/04/15 0110 06/04/15 0524  TROPONINI <0.03 <0.03   Glucose No results for input(s): GLUCAP in the last 168 hours.  Imaging X-ray Chest Pa And Lateral  06/03/2015  CLINICAL DATA:  59 year old female with progressive hemoptysis. Clinical history includes chronic COPD and smoking EXAM: CHEST  2 VIEW COMPARISON:  Prior chest x-ray 11/18/2013 FINDINGS: Cardiac and mediastinal contours are unchanged and remain within normal limits. Atherosclerotic calcification present in the transverse aorta. The lungs are markedly hyperinflated with increased anterior clear space and flattening of the diaphragms. Central bronchitic change and a background of emphysema. No focal airspace consolidation, pleural effusion or pneumothorax. No suspicious mass or nodule. Osseous structures intact and unremarkable. IMPRESSION: Stable chest x-ray with pulmonary parenchymal changes consistent with the clinical history of COPD. No findings to explain hemoptysis. Electronically Signed   By: Malachy Moan M.D.   On: 06/03/2015 19:16     ASSESSMENT / PLAN: Chronic  resp failure  COPD with acute exacerbation. Hemoptysis-->improved P:   Admitted from clinic to [facility] for monitoring. cont ceftriaxone and azithromycin; started 11/3>>> Solu-Medrol IV change to 60q12 Ceftriaxone, start date 06/03/15, day 1/x Azithromycin, start date 06/03/15, day 1/x Follow cultures Ck Dg esophagram  FOB at some point after dc  Hypothyroidism P:   F/u TSH. Continue  Synthroid  Severe anxiety P:   Xanax when necessary   FAMILY  - Updates:  - Inter-disciplinary family meet or Palliative Care meeting due by:  06/10/15

## 2015-06-04 NOTE — Progress Notes (Signed)
Initial Nutrition Assessment  DOCUMENTATION CODES:   Underweight, Severe malnutrition in context of chronic illness  INTERVENTION:  - Discontinue Ensure Enlive.  - Provide Boost Plus TID (each supplement contains 360 kcal and 14 gm protein).  - Provide Pro-Stat BID (each supplement contains 100 kcal and 15 gm protein).  - Order Multi-vitamin.  - Encouraged patient to maintain PO intake.  - RD team will continue to monitor for needs.  NUTRITION DIAGNOSIS:   Malnutrition related to chronic illness as evidenced by severe depletion of muscle mass, moderate depletion of body fat, energy intake < 75% for > or equal to 1 month.  GOAL:   Patient will meet greater than or equal to 90% of their needs, Weight gain  MONITOR:   PO intake, Supplement acceptance, Labs, Weight trends  REASON FOR ASSESSMENT:   Malnutrition Screening Tool    ASSESSMENT:   59 year old with very severe COPD. GOLD Stage IV. Has worsening symptoms of dyspnea on exertion, wheezing, cough productive of sputum. She started having hemoptysis in the pulmonary office today and was admitted for further evaluation.  Patient sitting upright in bed eating lunch during visit for MST score. Patient reports a good appetite, and denies any nausea, vomiting, or bowel dysfunction. She has asked the Doctor to be changed from a heart-healthy diet, which is affecting her intake because she does not like the food. She describes having recent swallowing problems and that eating can "strangulate" her, but does not know which foods cause these problems. She states she is afraid of aspirating and this fear is reducing her intake. She is currently being followed by Speech. Per chart, her meal completion is 50%.   Per patient, she reported knowing that she should be eating small, frequent meals throughout the day, but is having trouble meeting her nutritional needs. She has limited energy to cook, and relies on a friend to bring foods to her.  She frequently relies on microwave meals, or frozen foods. She states she usually snacks throughout the day. A usual breakfast is a honey-bun, and a package of NABS for lunch. She does report a history of lactose intolerance, but recently has started eating yogurt. She is concerned that her Ensure Supplement may cause her problems, but is open to trying other supplements. Also reported concerns about cost and how to access these supplements at home with limited transportation. RD team left patient a handout of supplement costs and coupons.   Patient states her weight has been trending downward, and that in March she weighed 122 lbs. This is a 5% weight loss, which is not significant for the time frame. She states she used to weigh 160 lbs before things started getting worse.  NFPE performed - patient has mild/moderate fat wasting - orbital, upper arm; severe muscle wasting - thigh, knee; and mild muscle wasting: temple, scapula, clavicle.   Labs reviewed: low Na (133), low Cl (91), high CO2 (35), low Cr (0.43), high WBC (11.9).   Medications reviewed.   Diet Order:  Diet Heart Room service appropriate?: Yes; Fluid consistency:: Thin  Skin:  Reviewed, no issues  Last BM:  unknown  Height:   Ht Readings from Last 1 Encounters:  06/03/15 5\' 8"  (1.727 m)    Weight:   Wt Readings from Last 1 Encounters:  06/04/15 116 lb 3.2 oz (52.708 kg)    Ideal Body Weight:  63.6 kg  BMI:  Body mass index is 17.67 kg/(m^2).  Estimated Nutritional Needs:   Kcal:  1500-1700  kcal  Protein:  65-75 gm  Fluid:  1.5-1.7 L/day  EDUCATION NEEDS:   No education needs identified at this time  Delano Metz, Dietetic Intern 06/04/2015 12:42 PM

## 2015-06-04 NOTE — Progress Notes (Signed)
eLink Physician-Brief Progress Note Patient Name: Elleah Hemsley DOB: May 29, 1956 MRN: [id number]   Date of Service  06/04/2015  HPI/Events of Note  Contacted by [facility] regarding EKG with ST elevation. EKG has early repolarization without reciprocal changes or q waves. RN reports patient without chest pain. Reportedly had similar presentation & abnormal EKG in March 2016 in [place] with "clean cath". Admitted with hemoptysis & COPD exacerbation. BNP normal. Vitals stable.  eICU Interventions  Troponin I q6hr x3     Intervention Category Intermediate Interventions: Diagnostic test evaluation  Lawanda Cousins 06/04/2015, 12:13 AM

## 2015-06-04 NOTE — Progress Notes (Signed)
eLink Physician-Brief Progress Note Patient Name: Steffanie Mingle DOB: 12-14-1955 MRN: [id number]   Date of Service  06/04/2015  HPI/Events of Note  Trop I <0.03  eICU Interventions  Continue trending Trop I     Intervention Category Intermediate Interventions: Diagnostic test evaluation  Lawanda Cousins 06/04/2015, 2:05 AM

## 2015-06-05 MED ORDER — GUAIFENESIN ER 600 MG PO TB12
1200.0000 mg | ORAL_TABLET | Freq: Two times a day (BID) | ORAL | Status: DC
  Administered 2015-06-05 – 2015-06-06 (×3): 1200 mg via ORAL
  Filled 2015-06-05 (×3): qty 2

## 2015-06-05 MED ORDER — PREDNISONE 5 MG PO TABS
30.0000 mg | ORAL_TABLET | Freq: Every day | ORAL | Status: DC
  Administered 2015-06-05 – 2015-06-06 (×2): 30 mg via ORAL
  Filled 2015-06-05 (×2): qty 1
  Filled 2015-06-05 (×2): qty 2

## 2015-06-05 MED ORDER — BUDESONIDE-FORMOTEROL FUMARATE 160-4.5 MCG/ACT IN AERO
2.0000 | INHALATION_SPRAY | Freq: Two times a day (BID) | RESPIRATORY_TRACT | Status: DC
  Administered 2015-06-05 – 2015-06-06 (×2): 2 via RESPIRATORY_TRACT
  Filled 2015-06-05: qty 6

## 2015-06-05 NOTE — Progress Notes (Signed)
PCCM PROGRESS NOTE  ADMISSION DATE: 06/03/2015  CC: Short of breath, hemoptysis  SUBJECTIVE: Breathing better.  No coughing up blood.  OBJECTIVE: BP 121/61 mmHg  Pulse 62  Temp(Src) 97.7 F (36.5 C) (Oral)  Resp 20  Ht 5\' 8"  (1.727 m)  Wt 116 lb 3.2 oz (52.708 kg)  BMI 17.67 kg/m2  SpO2 95%  I/O last 3 completed shifts: In: 1723 [P.O.:1620; I.V.:3; IV Piggyback:100] Out: 2700 [Urine:2700]  General: thin HEENT: no sinus tenderness Cardiac: regular Chest: decreased BS, no wheeze Abd: soft, non tender Ext: no edema Neuro: normal strength Skin: no rashes   CMP Latest Ref Rng 06/04/2015 06/03/2015 05/09/2010  Glucose 65 - 99 mg/dL 07/09/2010) 517(G) 94  BUN 6 - 20 mg/dL 11 9 4(L)  Creatinine 017(C - 1.00 mg/dL 9.44) 9.67(R 9.16  Sodium 135 - 145 mmol/L 133(L) 134(L) 132(L)  Potassium 3.5 - 5.1 mmol/L 4.8 4.1 4.1  Chloride 101 - 111 mmol/L 91(L) 91(L) 94(L)  CO2 22 - 32 mmol/L 35(H) 37(H) 29  Calcium 8.9 - 10.3 mg/dL 9.1 9.2 9.5  Total Protein 6.5 - 8.1 g/dL - 7.0 -  Total Bilirubin 0.3 - 1.2 mg/dL - 3.84) -  Alkaline Phos 38 - 126 U/L - 73 -  AST 15 - 41 U/L - 21 -  ALT 14 - 54 U/L - 18 -    CBC Latest Ref Rng 06/03/2015 05/09/2010 12/22/2009  WBC 4.0 - 10.5 K/uL 11.9(H) 6.8 9.0  Hemoglobin 12.0 - 15.0 g/dL 12/24/2009 99.3 57.0  Hematocrit 36.0 - 46.0 % 36.1 41.6 39.1  Platelets 150 - 400 K/uL 325 252 397    Lab Results  Component Value Date   TSH 1.847 06/03/2015    BNP    Component Value Date/Time   BNP 57.1 06/03/2015 1956    Cardiac Panel (last 3 results)  Recent Labs  06/04/15 0110 06/04/15 0524  TROPONINI <0.03 <0.03     X-ray Chest Pa And Lateral  06/03/2015  CLINICAL DATA:  59 year old female with progressive hemoptysis. Clinical history includes chronic COPD and smoking EXAM: CHEST  2 VIEW COMPARISON:  Prior chest x-ray 11/18/2013 FINDINGS: Cardiac and mediastinal contours are unchanged and remain within normal limits. Atherosclerotic calcification  present in the transverse aorta. The lungs are markedly hyperinflated with increased anterior clear space and flattening of the diaphragms. Central bronchitic change and a background of emphysema. No focal airspace consolidation, pleural effusion or pneumothorax. No suspicious mass or nodule. Osseous structures intact and unremarkable. IMPRESSION: Stable chest x-ray with pulmonary parenchymal changes consistent with the clinical history of COPD. No findings to explain hemoptysis. Electronically Signed   By: 11/20/2013 M.D.   On: 06/03/2015 19:16   Dg Esophagus  06/04/2015  CLINICAL DATA:  Aspiration.  Severe COPD. EXAM: ESOPHOGRAM / BARIUM SWALLOW / BARIUM TABLET STUDY TECHNIQUE: Combined double contrast and single contrast examination performed using effervescent crystals, thick barium liquid, and thin barium liquid. The patient was observed with fluoroscopy swallowing a 13 mm barium sulphate tablet. FLUOROSCOPY TIME:  Fluoroscopy Time:  1 minutes 0 seconds COMPARISON:  None. FINDINGS: The oropharyngeal swallowing mechanisms are normal. The mucosa and motility of the esophagus are normal. A 13 mm barium tablet passed immediately from the mouth to the stomach with no delay. No appreciable hiatal hernia. IMPRESSION: Normal barium esophagram. No aspiration. No hiatal hernia or esophagitis. Electronically Signed   By: 13/10/2014 M.D.   On: 06/04/2015 15:44    STUDIES: 11/04 Esophogram >> normal  EVENTS:  11/03 Admit 11/04 Chest pain  DISCUSSION: 59 yo female smoker admitted from office with hemoptysis and AECOPD.  She has hx of GOLD 4 COPD, and followed by Dr. Marchelle Gearing.  ASSESSMENT/PLAN:  COPD exacerbation. Hemoptysis. Plan: - Transition to symbicort and d/c pulmicort/brovana - continue spiriva, prn albuterol - d/c ipratropium from nebulizer regimen - day 3 of rocephin, zithromax - change to 30 mg prednisone 11/05 and d/c solumedrol - add flutter valve - change mucinex to 1200 mg  bid  Acute on chronic hypoxic respiratory failure >> uses 2 liters oxygen at home Plan: - oxygen to keep SpO2 90 to 95%  Tobacco abuse. Plan: - smoking cessation - nicotine pach  Hx of hypothyroidism. Plan: - continue synthroid  Anxiety. Plan: - continue zoloft - prn xanax  If stable, then likely d/c home 11/06.  Coralyn Helling, MD [facility] Pulmonary/Critical Care 06/05/2015, 12:26 PM Pager:  [phone] After 3pm call: [phone]

## 2015-06-06 ENCOUNTER — Telehealth: Payer: Self-pay | Admitting: Adult Health

## 2015-06-06 DIAGNOSIS — E43 Unspecified severe protein-calorie malnutrition: Secondary | ICD-10-CM

## 2015-06-06 MED ORDER — ALPRAZOLAM 0.25 MG PO TABS: 0.2500 mg | ORAL_TABLET | Freq: Three times a day (TID) | ORAL | Status: DC | PRN

## 2015-06-06 MED ORDER — PREDNISONE 10 MG PO TABS: ORAL_TABLET | ORAL | Status: DC

## 2015-06-06 MED ORDER — BUDESONIDE-FORMOTEROL FUMARATE 160-4.5 MCG/ACT IN AERO: 2.0000 | INHALATION_SPRAY | Freq: Two times a day (BID) | RESPIRATORY_TRACT | Status: DC

## 2015-06-06 MED ORDER — DOXYCYCLINE HYCLATE 100 MG PO CAPS: ORAL_CAPSULE | ORAL | Status: DC

## 2015-06-06 NOTE — Discharge Summary (Signed)
Physician Discharge Summary  Patient ID: Terri Stephens MRN: [id number] DOB/AGE: April 26, 1956 59 y.o.  Admit date: 06/03/2015 Discharge date: 06/06/2015    Discharge Diagnoses:  Active Problems:   COPD exacerbation (HCC)   Protein-calorie malnutrition, severe   Brief Summary: Terri Stephens is a 78 y.o. y/o female with a PMH of Gold IV COPD who presented 11/3 to the office with worsening dyspnea, wheezing and productive cough.  In the office she developed hemoptysis thought most likely r/t infectious bronchitis and was admitted to [facility] for further eval.  No evidence of mass on CXR although she is at high risk for malignancy.  She was treated for AECOPD with IV abx, IV steroids and bronchodilators.  W/u for hemoptysis was essentially negative and hemoptysis is now resolved.  She is back to her baseline respiratory status and ready for d/c home pending close outpt monitoring.     Consults: None   Lines/tubes: None   Microbiology/Sepsis markers: none  Significant Diagnostic Studies:  PA/Lat CXR 11/3>>> Stable chest x-ray with pulmonary parenchymal changes consistent with the clinical history of COPD. No findings to explain hemoptysis. Esophagram 11/4>>> Normal barium esophagram. No aspiration. No hiatal hernia or esophagitis.                                                                   D/c plan by Discharge Diagnosis COPD exacerbation. Hemoptysis >> resolved. Acute on chronic hypoxic respiratory failure >> uses 2 liters oxygen at home Plan: - continue spiriva, symbicort and prn albuterol - d/c'ed ipratropium from nebulizer regimen - PO doxycycline to complete 7 days total abx  - prednisone taper over next 4 days then stop  - flutter valve prn - mucinex to 1200 mg bid - continuous O2 as prior  - outpt pulmonary f/u in 1 week  - consider outpt bronchoscopy to r/o malignancy given smoking hx and hemoptysis   Tobacco abuse. Plan: - smoking cessation - nicotine  pach  Hx of hypothyroidism. Plan: - continue synthroid  Anxiety. Plan: - continue zoloft - prn xanax  Filed Vitals:   06/05/15 1155 06/05/15 2140 06/06/15 0556 06/06/15 0740  BP:  123/69 120/73   Pulse:  73 60   Temp:  98.2 F (36.8 C) 98.3 F (36.8 C)   TempSrc:  Oral Oral   Resp:  20 20   Height:      Weight:      SpO2: 95% 98% 100% 98%     Discharge Labs  BMET  Recent Labs Lab 06/03/15 1956 06/04/15 0524  NA 134* 133*  K 4.1 4.8  CL 91* 91*  CO2 37* 35*  GLUCOSE 123* 150*  BUN 9 11  CREATININE 0.46 0.43*  CALCIUM 9.2 9.1  PHOS  --  4.1     CBC   Recent Labs Lab 06/03/15 1956  HGB 12.1  HCT 36.1  WBC 11.9*  PLT 325   Anti-Coagulation  Recent Labs Lab 06/03/15 1956  INR 0.94         Medication List    STOP taking these medications        ADVAIR DISKUS 250-50 MCG/DOSE Aepb  Generic drug:  Fluticasone-Salmeterol     ipratropium-albuterol 0.5-2.5 (3) MG/3ML Soln  Commonly known as:  DUONEB  OMEGA 3 PO      TAKE these medications        ALPRAZolam 0.25 MG tablet  Commonly known as:  XANAX  Take 1 tablet (0.25 mg total) by mouth 3 (three) times daily as needed for anxiety.     aspirin EC 81 MG tablet  Take 81 mg by mouth daily.     budesonide-formoterol 160-4.5 MCG/ACT inhaler  Commonly known as:  SYMBICORT  Inhale 2 puffs into the lungs 2 (two) times daily.     doxycycline 100 MG capsule  Commonly known as:  VIBRAMYCIN  1 tablet by mouth twice daily x 3 days     guaifenesin 400 MG Tabs tablet  Commonly known as:  HUMIBID E  Take 800 mg by mouth 3 (three) times daily.     ibuprofen 200 MG tablet  Commonly known as:  ADVIL,MOTRIN  Take 800 mg by mouth 2 (two) times daily as needed for headache or moderate pain.     levothyroxine 50 MCG tablet  Commonly known as:  SYNTHROID, LEVOTHROID  Take 50 mcg by mouth daily.     MULTIVITAMIN & MINERAL PO  Take 1 tablet by mouth daily.     nicotine 14 mg/24hr patch   Commonly known as:  NICODERM CQ - dosed in mg/24 hours  Place 14 mg onto the skin daily.     predniSONE 10 MG tablet  Commonly known as:  DELTASONE  2 tabs po daily x 2 days then 1 tab po daily x 2 days then STOP     PROAIR HFA 108 (90 BASE) MCG/ACT inhaler  Generic drug:  albuterol  Inhale 1-2 puffs into the lungs every 6 (six) hours as needed for wheezing.     sertraline 100 MG tablet  Commonly known as:  ZOLOFT  Take 100 mg by mouth daily.     tiotropium 18 MCG inhalation capsule  Commonly known as:  SPIRIVA  Place 18 mcg into inhaler and inhale daily.     valACYclovir 1000 MG tablet  Commonly known as:  VALTREX  Take 1,000 mg by mouth as needed (outbreaks).          Disposition: Home   Discharged Condition: Katherina Wimer has met maximum benefit of inpatient care and is medically stable and cleared for discharge.  Patient is pending follow up as above.      Time spent on disposition:  Greater than 35 minutes.   SignedNickolas Madrid, NP 06/06/2015  10:17 AM Pager: [phone] or [phone]  Chesley Mires, MD [place] 06/06/2015, 1:06 PM Pager:  [phone] After 3pm call: [phone]

## 2015-06-06 NOTE — Progress Notes (Signed)
Pt able with 2 person assist and use of the steady able to use BSC and void. Post bladder scan results 0

## 2015-06-06 NOTE — Progress Notes (Signed)
Shift progressed uneventfully. Patient slept well overnight and denied any pain. Vital signs remained stable.   

## 2015-06-06 NOTE — Telephone Encounter (Signed)
Needs OV for hospital f/u in 1 week with TP

## 2015-06-06 NOTE — Progress Notes (Signed)
PCCM PROGRESS NOTE  ADMISSION DATE: 06/03/2015  CC: Short of breath, hemoptysis  SUBJECTIVE: Anxious to go home.  OBJECTIVE: BP 120/73 mmHg  Pulse 60  Temp(Src) 98.3 F (36.8 C) (Oral)  Resp 20  Ht 5\' 8"  (1.727 m)  Wt 116 lb 3.2 oz (52.708 kg)  BMI 17.67 kg/m2  SpO2 98%  I/O last 3 completed shifts: In: 830 [P.O.:480; IV Piggyback:350] Out: 2450 [Urine:2450]  General: thin HEENT: no sinus tenderness Cardiac: regular Chest: decreased BS, no wheeze Abd: soft, non tender Ext: no edema Neuro: normal strength Skin: no rashes   CMP Latest Ref Rng 06/04/2015 06/03/2015 05/09/2010  Glucose 65 - 99 mg/dL 07/09/2010) 782(U) 94  BUN 6 - 20 mg/dL 11 9 4(L)  Creatinine 235(T - 1.00 mg/dL 6.14) 4.31(V 4.00  Sodium 135 - 145 mmol/L 133(L) 134(L) 132(L)  Potassium 3.5 - 5.1 mmol/L 4.8 4.1 4.1  Chloride 101 - 111 mmol/L 91(L) 91(L) 94(L)  CO2 22 - 32 mmol/L 35(H) 37(H) 29  Calcium 8.9 - 10.3 mg/dL 9.1 9.2 9.5  Total Protein 6.5 - 8.1 g/dL - 7.0 -  Total Bilirubin 0.3 - 1.2 mg/dL - 8.67) -  Alkaline Phos 38 - 126 U/L - 73 -  AST 15 - 41 U/L - 21 -  ALT 14 - 54 U/L - 18 -    CBC Latest Ref Rng 06/03/2015 05/09/2010 12/22/2009  WBC 4.0 - 10.5 K/uL 11.9(H) 6.8 9.0  Hemoglobin 12.0 - 15.0 g/dL 12/24/2009 50.9 32.6  Hematocrit 36.0 - 46.0 % 36.1 41.6 39.1  Platelets 150 - 400 K/uL 325 252 397    Lab Results  Component Value Date   TSH 1.847 06/03/2015    BNP    Component Value Date/Time   BNP 57.1 06/03/2015 1956    Cardiac Panel (last 3 results)  Recent Labs  06/04/15 0110 06/04/15 0524  TROPONINI <0.03 <0.03     Dg Esophagus  06/04/2015  CLINICAL DATA:  Aspiration.  Severe COPD. EXAM: ESOPHOGRAM / BARIUM SWALLOW / BARIUM TABLET STUDY TECHNIQUE: Combined double contrast and single contrast examination performed using effervescent crystals, thick barium liquid, and thin barium liquid. The patient was observed with fluoroscopy swallowing a 13 mm barium sulphate tablet.  FLUOROSCOPY TIME:  Fluoroscopy Time:  1 minutes 0 seconds COMPARISON:  None. FINDINGS: The oropharyngeal swallowing mechanisms are normal. The mucosa and motility of the esophagus are normal. A 13 mm barium tablet passed immediately from the mouth to the stomach with no delay. No appreciable hiatal hernia. IMPRESSION: Normal barium esophagram. No aspiration. No hiatal hernia or esophagitis. Electronically Signed   By: 13/10/2014 M.D.   On: 06/04/2015 15:44    STUDIES: 11/04 Esophogram >> normal  EVENTS: 11/03 Admit 11/04 Chest pain  DISCUSSION: 59 yo female smoker admitted from office with hemoptysis and AECOPD.  She has hx of GOLD 4 COPD.  ASSESSMENT/PLAN:  COPD exacerbation. Hemoptysis >> resolved. Plan: - continue spiriva, symbicort and prn albuterol - d/c'ed ipratropium from nebulizer regimen - day 4/7 Abx >> change to po doxycycline 100 mg bid - 30 mg prednisone 11/06 >> change to 20 mg on 11/07 and wean off over next 4 days - flutter valve prn - mucinex to 1200 mg bid  Acute on chronic hypoxic respiratory failure >> uses 2 liters oxygen at home Plan: - oxygen to keep SpO2 90 to 95%  Tobacco abuse. Plan: - smoking cessation - nicotine pach  Hx of hypothyroidism. Plan: - continue synthroid  Anxiety. Plan: -  continue zoloft - prn xanax  D/c home today.  She will need follow up with Dr. Isaiah Serge or Tammy Parrett within 7 days.  Coralyn Helling, MD [facility] Pulmonary/Critical Care 06/06/2015, 9:34 AM Pager:  [phone] After 3pm call: [phone]

## 2015-06-07 NOTE — Telephone Encounter (Signed)
Spoke with pt. States that she does not want to make an appointment at this time. Has upcoming appointment with Dr. Isaiah Serge next month. Nothing further was needed.

## 2015-06-09 ENCOUNTER — Telehealth: Payer: Self-pay | Admitting: Pulmonary Disease

## 2015-06-09 NOTE — Telephone Encounter (Signed)
Spoke with the pt  She states that her hospital d/c instructions say to d/c Duoneb, but nothing about taking plain albuterol nebs  She states that she was told by one of the physicians that she needed to use albuterol nebs before symbicort "to open my airways so symbicort will work" She is requesting rx for this  Will forward to VS for recs since he d/c'ed the pt  Please advise, thanks!

## 2015-06-11 MED ORDER — ALBUTEROL SULFATE (2.5 MG/3ML) 0.083% IN NEBU: 2.5000 mg | INHALATION_SOLUTION | Freq: Four times a day (QID) | RESPIRATORY_TRACT | Status: DC | PRN

## 2015-06-11 NOTE — Telephone Encounter (Signed)
Spoke with pt. She is aware of VS's recommendations. Does not have albuterol nebs, these will need to be sent to her pharmacy. This has been done. Nothing further was needed.

## 2015-06-11 NOTE — Telephone Encounter (Signed)
She should be on:  1) spiriva one puff daily. 2) symbicort two puffs twice daily 3) albuterol nebulizer one vial every 4 to 6 hours as needed for cough, wheeze, chest congestion, or shortness of breath

## 2015-06-14 ENCOUNTER — Telehealth: Payer: Self-pay | Admitting: Pulmonary Disease

## 2015-06-14 DIAGNOSIS — R0600 Dyspnea, unspecified: Secondary | ICD-10-CM

## 2015-06-14 DIAGNOSIS — R0781 Pleurodynia: Secondary | ICD-10-CM

## 2015-06-14 MED ORDER — PREDNISONE 10 MG PO TABS: ORAL_TABLET | ORAL | Status: DC

## 2015-06-14 NOTE — Telephone Encounter (Signed)
Terri Stephens [facility]) patient has been having pain in her back since she got out of the hospital, in mid-back, wrapping around her ribs to abdominal area.  Patient having trouble breathing with this pain.  The only medication she is taking for pain is Advil.  She was on a Prednisone taper, but finished it after leaving the hospital. Patient has irregular heartbeat, EKG was abnormal at hospital. Terri Stephens is requesting that Dr. Isaiah Serge review her records from the hospital and give his recommendations.   Pharmacy: [facility]- [address]  Allergies  Allergen Reactions  . Bee Venom Anaphylaxis  . Oxycodone Hcl Shortness Of Breath, Itching and Swelling    REACTION: All "codones" in any form

## 2015-06-14 NOTE — Telephone Encounter (Signed)
Chest x ray Pred taper starting at 60 mg. Reduce by 10 mg every 3 days

## 2015-06-14 NOTE — Telephone Encounter (Signed)
Called and spoke to pt. Informed her of the recs per PM. Rx sent to preferred pharmacy. Pt verbalized understanding and denied any further questions or concerns at this time.   

## 2015-06-15 ENCOUNTER — Ambulatory Visit (INDEPENDENT_AMBULATORY_CARE_PROVIDER_SITE_OTHER)
Admission: RE | Admit: 2015-06-15 | Discharge: 2015-06-15 | Disposition: A | Discharge status: Home / Self Care | Payer: BLUE CROSS/BLUE SHIELD | Source: Ambulatory Visit | Attending: Pulmonary Disease | Admitting: Pulmonary Disease

## 2015-06-15 DIAGNOSIS — R0781 Pleurodynia: Secondary | ICD-10-CM

## 2015-06-15 DIAGNOSIS — R0600 Dyspnea, unspecified: Secondary | ICD-10-CM | POA: Diagnosis not present

## 2015-06-17 ENCOUNTER — Telehealth: Payer: Self-pay | Admitting: Pulmonary Disease

## 2015-06-17 ENCOUNTER — Ambulatory Visit (INDEPENDENT_AMBULATORY_CARE_PROVIDER_SITE_OTHER): Payer: BLUE CROSS/BLUE SHIELD | Admitting: Pulmonary Disease

## 2015-06-17 ENCOUNTER — Encounter: Payer: Self-pay | Admitting: Pulmonary Disease

## 2015-06-17 VITALS — BP 126/74 | HR 83 | Ht 68.0 in | Wt 128.8 lb

## 2015-06-17 DIAGNOSIS — R079 Chest pain, unspecified: Secondary | ICD-10-CM

## 2015-06-17 DIAGNOSIS — J441 Chronic obstructive pulmonary disease with (acute) exacerbation: Secondary | ICD-10-CM | POA: Diagnosis not present

## 2015-06-17 MED ORDER — IBUPROFEN 800 MG PO TABS: 800.0000 mg | ORAL_TABLET | Freq: Three times a day (TID) | ORAL | Status: DC | PRN

## 2015-06-17 NOTE — Progress Notes (Signed)
   Subjective:    Patient ID: Terri Stephens, female    DOB: 05/28/1956, 59 y.o.   MRN: [id number]  HPI Follow-up for management of COPD  Mrs. Elks is a 59 year old with very severe COPD. GOLD Stage IV. She was seen at [facility] pulmonary in 2008 by Dr. Marchelle Gearing. She was then lost to follow-up since she moved to [place]. She had been maintained on Advair and Spiriva. She had an admission in [place] on 10/07/14 for an acute exacerbation of COPD. She was treated with antibiotics and steroids. She also underwent left heart catheterization for elevated troponins which did not show any coronary artery disease.   Since then she has felt progressively worse with increasing dyspnea on exertion. It has reached a point where she cannot even walk across the room without having to stop. She moved to [facility] to resume pulmonary care as she felt she wasn't getting proper care at [facility].  Interim history: She was seen in the clinic on 11/3. At that time she had mild exacerbation of COPD and started having hemoptysis. She was admitted to [facility] for observation and management. She was treated with IV antibiotics and IV steroids and bronchodilators. Hemoptysis has resolved. Since her discharge she says that her breathing is much improved. However she has back pain that starts around her mid thoracic spine and radiates on both sides to the front. This is exacerbated by taking a deep breath.  PFTs [07/17/07] FVC 1.66 [46%] FEV1 0.68 [23%] F/F 40 TLC 118% DLCO 47%  Review of Systems  Has severe dyspnea on exertion, cough productive of sputum, No hemoptysis.  Denies any fever, chills, malaise, weight loss, loss of appetite. Denies any chest pain, palpitations. Denies any nausea, vomiting, diarrhea, constipation. All other review of systems negative.    Objective:   Physical Exam  Blood pressure 126/74, pulse 83, height 5\' 8"  (1.727 m), weight 128 lb 12.8 oz (58.423 kg), SpO2 93 %.  Gen:  No distress Neuro: No gross focal deficits. Neck: No JVD, lymphadenopathy, thyromegaly. RS: Distant breath sounds, no wheeze or crackles, nonlabored. CVS: S1-S2 heard, no murmurs rubs gallops. Abdomen: Soft, positive bowel sounds. Extremities: No edema.    Assessment & Plan:  #1 COPD Gold stage IV She's had a couple of exacerbations with hospitalizations this year. She had a brief episode of hemoptysis earlier this month which has resolved spontaneously. She continues on the Symbicort and Spiriva. She says that the addition of Symbicort has improved her breathing. She continues to have back and rib pain. Chest x-ray has been negative. I suspect this is musculoskeletal but we'll get a CT angiogram of the chest for further evaluation. She'll continue on the prednisone taper as instructed. She is currently at 50 mg and will reduce by 10 mg every 3 days.  #2 Active smoker I have advised her to strongly to quit smoking. She is trying to cut down her cigarettes and is down to half pack a day. She does not need any additional help to quit. I spent about 5 minutes in this counseling  Plan: - Continue Symbicort and Spiriva - CT angiogram of chest - Prednisone taper - Pulmonary function tests - Consult to quit smoking.  MD [place] Pulmonary and Critical Care Pager [phone] If no answer or after 3pm call: [phone] 06/17/2015, 3:50 PM

## 2015-06-17 NOTE — Telephone Encounter (Signed)
atc LINE BUSY WCB

## 2015-06-17 NOTE — Patient Instructions (Signed)
We will schedule you for a CT of the chest. Pulmonary function tests Ibuprofen PRN for back pain Continue Symbicort and Spiriva  Return to clinic in 3 months

## 2015-06-17 NOTE — Telephone Encounter (Addendum)
Spoke with the pt's EC Terri Stephens She states that the pt is still having a lot of trouble with her breathing She continues to have pain in her back and rob area- minimal improvement on ibuprofen  OV with PM today for eval

## 2015-06-21 ENCOUNTER — Ambulatory Visit (INDEPENDENT_AMBULATORY_CARE_PROVIDER_SITE_OTHER)
Admission: RE | Admit: 2015-06-21 | Discharge: 2015-06-21 | Disposition: A | Discharge status: Home / Self Care | Payer: BLUE CROSS/BLUE SHIELD | Source: Ambulatory Visit | Attending: Pulmonary Disease | Admitting: Pulmonary Disease

## 2015-06-21 ENCOUNTER — Telehealth: Payer: Self-pay | Admitting: Pulmonary Disease

## 2015-06-21 DIAGNOSIS — R079 Chest pain, unspecified: Secondary | ICD-10-CM | POA: Diagnosis not present

## 2015-06-21 MED ORDER — IOHEXOL 350 MG/ML SOLN
80.0000 mL | Freq: Once | INTRAVENOUS | Status: AC | PRN
  Administered 2015-06-21: 80 mL via INTRAVENOUS

## 2015-06-21 NOTE — Telephone Encounter (Signed)
Marleen,caregiver,[phone] states patient is in a great deal of pain and the patient is having a hard time catching her breath. Wanting to know what she can do to help her. She states she is anxious for CT results.

## 2015-06-21 NOTE — Telephone Encounter (Signed)
Spoke with pt's friend, Malen Gauze.  She is aware of results/recs.    Forwarding to PM to make aware of recs.  Please advise if anything further is needed.  Thanks!

## 2015-06-21 NOTE — Telephone Encounter (Signed)
CT does not show fracture or PE or pneumnonia or anything to explain pain. She does have copd on CT but we knew that  Plan  - go to ER if despeartge - fwd message to [facility] so he can tackle it from box tonight   Ct Angio Chest W/cm &/or Wo Cm  06/21/2015  CLINICAL DATA:  Severe back pain. Recent hospitalization for COPD exacerbation. On oxygen for chronic shortness of breath with exertion. EXAM: CT ANGIOGRAPHY CHEST WITH CONTRAST TECHNIQUE: Multidetector CT imaging of the chest was performed using the standard protocol during bolus administration of intravenous contrast. Multiplanar CT image reconstructions and MIPs were obtained to evaluate the vascular anatomy. CONTRAST:  46mL OMNIPAQUE IOHEXOL 350 MG/ML SOLN COMPARISON:  Chest x-ray 06/15/2015 FINDINGS: No filling defects in the pulmonary arteries to suggest pulmonary emboli. Heart is normal size. Aorta is normal caliber. Scattered aortic calcifications. No mediastinal, hilar, or axillary adenopathy. Chest wall soft tissues are unremarkable. Imaging into the upper abdomen shows no acute findings. Advanced COPD changes. No confluent opacities or suspicious pulmonary nodules. No pleural effusions. No acute bony abnormality. Review of the MIP images confirms the above findings. IMPRESSION: No evidence of pulmonary embolus. Advanced COPD. Aortic atherosclerosis. Electronically Signed   By: Charlett Nose M.D.   On: 06/21/2015 08:52

## 2015-06-21 NOTE — Telephone Encounter (Signed)
Malen Gauze called concerned about patient. Pain in ribs, sides has increased and she cannot breath.  She is unable to catch her breath and unable to use inhalers because she cannot breath in  They tried the Albuterol neb treatments and that did not work either.  Feeling tightness all over chest and ribs. Advised her to take patient to ER.   FYI to Dr. Isaiah Serge

## 2015-06-21 NOTE — Telephone Encounter (Signed)
Agree, I believe she needs to be evaluated in ED.

## 2015-06-21 NOTE — Telephone Encounter (Signed)
Spoke with pt's friend Malen Gauze, says that after taking a xanax pt has calmed down-sob has improved, but pt still having chest tightness and pain.  I advised Malen Gauze that if pt is still having chest pain she needs to be evaluated per RB.  Malen Gauze states that pt refuses to be seen in ED.  I again advised that if she does not improved or if she worsens to reiterate that she needs to be evaluated.  marleen expressed understanding.    Pt still requesting CTa results from today- requesting results today.  Sending to doc of the day as PM is unavailable until late tonight.  MR please advise on CTa.  Thanks!

## 2015-06-21 NOTE — Telephone Encounter (Signed)
Terri Stephens cb, pt refused to go to ER, patient has taken a xanax and is doing much better now, [phone]

## 2015-06-21 NOTE — Telephone Encounter (Signed)
Dr. Isaiah Serge is in 11pm elink.  Pt is requesting her CTa results today. Will forward to doc of the morning. Please advise RB thanks

## 2015-06-22 ENCOUNTER — Inpatient Hospital Stay (HOSPITAL_COMMUNITY)
Admission: EM | Admit: 2015-06-22 | Discharge: 2015-06-27 | DRG: 190 | Disposition: A | Discharge status: Skilled Nursing Facility | Payer: BLUE CROSS/BLUE SHIELD | Attending: Internal Medicine | Admitting: Internal Medicine

## 2015-06-22 ENCOUNTER — Encounter (HOSPITAL_COMMUNITY): Payer: Self-pay | Admitting: Emergency Medicine

## 2015-06-22 ENCOUNTER — Emergency Department (HOSPITAL_COMMUNITY): Payer: BLUE CROSS/BLUE SHIELD

## 2015-06-22 DIAGNOSIS — Z515 Encounter for palliative care: Secondary | ICD-10-CM | POA: Diagnosis not present

## 2015-06-22 DIAGNOSIS — J9602 Acute respiratory failure with hypercapnia: Secondary | ICD-10-CM | POA: Insufficient documentation

## 2015-06-22 DIAGNOSIS — Z9981 Dependence on supplemental oxygen: Secondary | ICD-10-CM | POA: Diagnosis not present

## 2015-06-22 DIAGNOSIS — F329 Major depressive disorder, single episode, unspecified: Secondary | ICD-10-CM | POA: Diagnosis present

## 2015-06-22 DIAGNOSIS — Z8249 Family history of ischemic heart disease and other diseases of the circulatory system: Secondary | ICD-10-CM | POA: Diagnosis not present

## 2015-06-22 DIAGNOSIS — Z791 Long term (current) use of non-steroidal anti-inflammatories (NSAID): Secondary | ICD-10-CM | POA: Diagnosis not present

## 2015-06-22 DIAGNOSIS — Z7982 Long term (current) use of aspirin: Secondary | ICD-10-CM

## 2015-06-22 DIAGNOSIS — Z87892 Personal history of anaphylaxis: Secondary | ICD-10-CM

## 2015-06-22 DIAGNOSIS — Z885 Allergy status to narcotic agent status: Secondary | ICD-10-CM | POA: Diagnosis not present

## 2015-06-22 DIAGNOSIS — J441 Chronic obstructive pulmonary disease with (acute) exacerbation: Secondary | ICD-10-CM | POA: Diagnosis present

## 2015-06-22 DIAGNOSIS — Z7952 Long term (current) use of systemic steroids: Secondary | ICD-10-CM | POA: Diagnosis not present

## 2015-06-22 DIAGNOSIS — F419 Anxiety disorder, unspecified: Secondary | ICD-10-CM | POA: Diagnosis present

## 2015-06-22 DIAGNOSIS — Z7951 Long term (current) use of inhaled steroids: Secondary | ICD-10-CM

## 2015-06-22 DIAGNOSIS — Z825 Family history of asthma and other chronic lower respiratory diseases: Secondary | ICD-10-CM | POA: Diagnosis not present

## 2015-06-22 DIAGNOSIS — R739 Hyperglycemia, unspecified: Secondary | ICD-10-CM | POA: Diagnosis present

## 2015-06-22 DIAGNOSIS — Z91030 Bee allergy status: Secondary | ICD-10-CM | POA: Diagnosis not present

## 2015-06-22 DIAGNOSIS — J9621 Acute and chronic respiratory failure with hypoxia: Secondary | ICD-10-CM | POA: Diagnosis present

## 2015-06-22 DIAGNOSIS — G8929 Other chronic pain: Secondary | ICD-10-CM | POA: Diagnosis present

## 2015-06-22 DIAGNOSIS — F17210 Nicotine dependence, cigarettes, uncomplicated: Secondary | ICD-10-CM | POA: Diagnosis present

## 2015-06-22 DIAGNOSIS — E039 Hypothyroidism, unspecified: Secondary | ICD-10-CM | POA: Diagnosis present

## 2015-06-22 DIAGNOSIS — Z803 Family history of malignant neoplasm of breast: Secondary | ICD-10-CM | POA: Diagnosis not present

## 2015-06-22 DIAGNOSIS — R0789 Other chest pain: Secondary | ICD-10-CM | POA: Diagnosis present

## 2015-06-22 DIAGNOSIS — J9622 Acute and chronic respiratory failure with hypercapnia: Secondary | ICD-10-CM | POA: Diagnosis present

## 2015-06-22 DIAGNOSIS — E872 Acidosis: Secondary | ICD-10-CM | POA: Diagnosis present

## 2015-06-22 DIAGNOSIS — Z66 Do not resuscitate: Secondary | ICD-10-CM | POA: Diagnosis present

## 2015-06-22 DIAGNOSIS — M069 Rheumatoid arthritis, unspecified: Secondary | ICD-10-CM | POA: Diagnosis present

## 2015-06-22 DIAGNOSIS — Z801 Family history of malignant neoplasm of trachea, bronchus and lung: Secondary | ICD-10-CM | POA: Diagnosis not present

## 2015-06-22 DIAGNOSIS — M549 Dorsalgia, unspecified: Secondary | ICD-10-CM | POA: Diagnosis present

## 2015-06-22 DIAGNOSIS — D72829 Elevated white blood cell count, unspecified: Secondary | ICD-10-CM | POA: Diagnosis present

## 2015-06-22 DIAGNOSIS — F4323 Adjustment disorder with mixed anxiety and depressed mood: Secondary | ICD-10-CM | POA: Diagnosis not present

## 2015-06-22 DIAGNOSIS — M545 Low back pain: Secondary | ICD-10-CM | POA: Diagnosis not present

## 2015-06-22 LAB — POCT I-STAT 3, ART BLOOD GAS (G3+)
ACID-BASE EXCESS: 14 mmol/L — AB (ref 0.0–2.0)
Bicarbonate: 42.2 mEq/L — ABNORMAL HIGH (ref 20.0–24.0)
O2 SAT: 98 %
PH ART: 7.389 (ref 7.350–7.450)
PO2 ART: 119 mmHg — AB (ref 80.0–100.0)
TCO2: 44 mmol/L (ref 0–100)
pCO2 arterial: 70 mmHg (ref 35.0–45.0)

## 2015-06-22 LAB — I-STAT ARTERIAL BLOOD GAS, ED
ACID-BASE EXCESS: 18 mmol/L — AB (ref 0.0–2.0)
BICARBONATE: 48.1 meq/L — AB (ref 20.0–24.0)
O2 SAT: 100 %
PO2 ART: 500 mmHg — AB (ref 80.0–100.0)
pCO2 arterial: 90.7 mmHg (ref 35.0–45.0)
pH, Arterial: 7.333 — ABNORMAL LOW (ref 7.350–7.450)

## 2015-06-22 LAB — CBC WITH DIFFERENTIAL/PLATELET
BASOS PCT: 0 %
Basophils Absolute: 0 10*3/uL (ref 0.0–0.1)
EOS PCT: 0 %
Eosinophils Absolute: 0 10*3/uL (ref 0.0–0.7)
HEMATOCRIT: 36.4 % (ref 36.0–46.0)
HEMOGLOBIN: 11.8 g/dL — AB (ref 12.0–15.0)
LYMPHS ABS: 3.5 10*3/uL (ref 0.7–4.0)
Lymphocytes Relative: 22 %
MCH: 31.4 pg (ref 26.0–34.0)
MCHC: 32.4 g/dL (ref 30.0–36.0)
MCV: 96.8 fL (ref 78.0–100.0)
MONO ABS: 0.8 10*3/uL (ref 0.1–1.0)
MONOS PCT: 5 %
NEUTROS PCT: 73 %
Neutro Abs: 11.4 10*3/uL — ABNORMAL HIGH (ref 1.7–7.7)
Platelets: 342 10*3/uL (ref 150–400)
RBC: 3.76 MIL/uL — ABNORMAL LOW (ref 3.87–5.11)
RDW: 13 % (ref 11.5–15.5)
WBC: 15.7 10*3/uL — ABNORMAL HIGH (ref 4.0–10.5)

## 2015-06-22 LAB — BLOOD GAS, ARTERIAL
Acid-Base Excess: 11.6 mmol/L — ABNORMAL HIGH (ref 0.0–2.0)
Bicarbonate: 39.3 mEq/L — ABNORMAL HIGH (ref 20.0–24.0)
DELIVERY SYSTEMS: POSITIVE
DRAWN BY: 345601
Expiratory PAP: 5
FIO2: 0.6
INSPIRATORY PAP: 10
O2 Saturation: 99.4 %
Patient temperature: 96.3
TCO2: 42.4 mmol/L (ref 0–100)
pCO2 arterial: 93.7 mmHg (ref 35.0–45.0)
pH, Arterial: 7.237 — ABNORMAL LOW (ref 7.350–7.450)
pO2, Arterial: 290 mmHg — ABNORMAL HIGH (ref 80.0–100.0)

## 2015-06-22 LAB — COMPREHENSIVE METABOLIC PANEL
ALK PHOS: 67 U/L (ref 38–126)
ALT: 18 U/L (ref 14–54)
AST: 20 U/L (ref 15–41)
Albumin: 3.6 g/dL (ref 3.5–5.0)
Anion gap: 5 (ref 5–15)
BILIRUBIN TOTAL: 0.5 mg/dL (ref 0.3–1.2)
BUN: 10 mg/dL (ref 6–20)
CALCIUM: 8.8 mg/dL — AB (ref 8.9–10.3)
CO2: 40 mmol/L — ABNORMAL HIGH (ref 22–32)
CREATININE: 0.44 mg/dL (ref 0.44–1.00)
Chloride: 89 mmol/L — ABNORMAL LOW (ref 101–111)
GFR calc Af Amer: >60 mL/min (ref >60)
GLUCOSE: 121 mg/dL — AB (ref 65–99)
POTASSIUM: 4 mmol/L (ref 3.5–5.1)
Sodium: 134 mmol/L — ABNORMAL LOW (ref 135–145)
TOTAL PROTEIN: 6.6 g/dL (ref 6.5–8.1)

## 2015-06-22 LAB — PHOSPHORUS: PHOSPHORUS: 3.1 mg/dL (ref 2.5–4.6)

## 2015-06-22 LAB — GLUCOSE, CAPILLARY
GLUCOSE-CAPILLARY: 113 mg/dL — AB (ref 65–99)
GLUCOSE-CAPILLARY: 106 mg/dL — AB (ref 65–99)

## 2015-06-22 LAB — TROPONIN I

## 2015-06-22 LAB — BRAIN NATRIURETIC PEPTIDE: B Natriuretic Peptide: 94.8 pg/mL (ref 0.0–100.0)

## 2015-06-22 LAB — MRSA PCR SCREENING: MRSA BY PCR: NEGATIVE

## 2015-06-22 LAB — MAGNESIUM: Magnesium: 2.5 mg/dL — ABNORMAL HIGH (ref 1.7–2.4)

## 2015-06-22 MED ORDER — SERTRALINE HCL 100 MG PO TABS: 100.0000 mg | ORAL_TABLET | Freq: Every day | ORAL | Status: DC

## 2015-06-22 MED ORDER — PANTOPRAZOLE SODIUM 40 MG IV SOLR
40.0000 mg | Freq: Every day | INTRAVENOUS | Status: DC
  Administered 2015-06-22: 40 mg via INTRAVENOUS
  Filled 2015-06-22 (×2): qty 40

## 2015-06-22 MED ORDER — ASPIRIN EC 81 MG PO TBEC: 81.0000 mg | DELAYED_RELEASE_TABLET | Freq: Every day | ORAL | Status: DC

## 2015-06-22 MED ORDER — ALBUTEROL SULFATE (2.5 MG/3ML) 0.083% IN NEBU: 2.5000 mg | INHALATION_SOLUTION | Freq: Four times a day (QID) | RESPIRATORY_TRACT | Status: DC | PRN

## 2015-06-22 MED ORDER — LEVOFLOXACIN IN D5W 750 MG/150ML IV SOLN: 750.0000 mg | INTRAVENOUS | Status: DC

## 2015-06-22 MED ORDER — METHYLPREDNISOLONE SODIUM SUCC 125 MG IJ SOLR
60.0000 mg | Freq: Four times a day (QID) | INTRAMUSCULAR | Status: DC
  Administered 2015-06-22 – 2015-06-23 (×4): 60 mg via INTRAVENOUS
  Filled 2015-06-22 (×2): qty 0.96
  Filled 2015-06-22: qty 2
  Filled 2015-06-22: qty 0.96
  Filled 2015-06-22 (×2): qty 2
  Filled 2015-06-22: qty 0.96
  Filled 2015-06-22: qty 2

## 2015-06-22 MED ORDER — ASPIRIN 81 MG PO CHEW
81.0000 mg | CHEWABLE_TABLET | Freq: Every day | ORAL | Status: DC
  Administered 2015-06-22 – 2015-06-27 (×6): 81 mg via ORAL
  Filled 2015-06-22 (×6): qty 1

## 2015-06-22 MED ORDER — SODIUM CHLORIDE 0.9 % IV SOLN
INTRAVENOUS | Status: DC
  Administered 2015-06-22: 12:00:00 via INTRAVENOUS

## 2015-06-22 MED ORDER — BUDESONIDE 0.5 MG/2ML IN SUSP
0.5000 mg | Freq: Two times a day (BID) | RESPIRATORY_TRACT | Status: DC
  Administered 2015-06-22 (×2): 0.5 mg via RESPIRATORY_TRACT
  Filled 2015-06-22 (×7): qty 2

## 2015-06-22 MED ORDER — ALPRAZOLAM 0.25 MG PO TABS: 12.5000 mg | ORAL_TABLET | Freq: Three times a day (TID) | ORAL | Status: DC | PRN

## 2015-06-22 MED ORDER — LEVOTHYROXINE SODIUM 50 MCG PO TABS
50.0000 ug | ORAL_TABLET | Freq: Every day | ORAL | Status: DC
  Administered 2015-06-23 – 2015-06-27 (×5): 50 ug via ORAL
  Filled 2015-06-22 (×6): qty 1

## 2015-06-22 MED ORDER — ENOXAPARIN SODIUM 40 MG/0.4ML ~~LOC~~ SOLN
40.0000 mg | SUBCUTANEOUS | Status: DC
  Administered 2015-06-22: 40 mg via SUBCUTANEOUS
  Filled 2015-06-22 (×2): qty 0.4

## 2015-06-22 MED ORDER — IPRATROPIUM-ALBUTEROL 0.5-2.5 (3) MG/3ML IN SOLN
3.0000 mL | RESPIRATORY_TRACT | Status: DC
  Administered 2015-06-22 – 2015-06-27 (×30): 3 mL via RESPIRATORY_TRACT
  Filled 2015-06-22 (×30): qty 3

## 2015-06-22 MED ORDER — SERTRALINE HCL 100 MG PO TABS
100.0000 mg | ORAL_TABLET | Freq: Every day | ORAL | Status: DC
  Administered 2015-06-22 – 2015-06-27 (×6): 100 mg via ORAL
  Filled 2015-06-22 (×8): qty 1

## 2015-06-22 MED ORDER — CETYLPYRIDINIUM CHLORIDE 0.05 % MT LIQD
7.0000 mL | Freq: Two times a day (BID) | OROMUCOSAL | Status: DC
  Administered 2015-06-23 (×2): 7 mL via OROMUCOSAL

## 2015-06-22 MED ORDER — ALPRAZOLAM 0.25 MG PO TABS
0.2500 mg | ORAL_TABLET | Freq: Three times a day (TID) | ORAL | Status: DC | PRN
Start: 2015-06-22 — End: 2015-06-27
  Administered 2015-06-22 – 2015-06-25 (×6): 0.25 mg via ORAL
  Filled 2015-06-22 (×6): qty 1

## 2015-06-22 MED ORDER — METHYLPREDNISOLONE SODIUM SUCC 125 MG IJ SOLR: 60.0000 mg | Freq: Four times a day (QID) | INTRAMUSCULAR | Status: DC

## 2015-06-22 MED ORDER — NICOTINE 14 MG/24HR TD PT24
14.0000 mg | MEDICATED_PATCH | Freq: Every day | TRANSDERMAL | Status: DC
  Administered 2015-06-22 – 2015-06-27 (×6): 14 mg via TRANSDERMAL
  Filled 2015-06-22 (×7): qty 1

## 2015-06-22 MED ORDER — ARFORMOTEROL TARTRATE 15 MCG/2ML IN NEBU: 15.0000 ug | INHALATION_SOLUTION | Freq: Two times a day (BID) | RESPIRATORY_TRACT | Status: DC

## 2015-06-22 MED ORDER — ALBUTEROL SULFATE (2.5 MG/3ML) 0.083% IN NEBU
5.0000 mg | INHALATION_SOLUTION | RESPIRATORY_TRACT | Status: AC
Start: 2015-06-22 — End: 2015-06-22
  Administered 2015-06-22: 5 mg via RESPIRATORY_TRACT
  Filled 2015-06-22: qty 6

## 2015-06-22 MED ORDER — LEVOFLOXACIN IN D5W 500 MG/100ML IV SOLN
500.0000 mg | INTRAVENOUS | Status: DC
  Filled 2015-06-22: qty 100

## 2015-06-22 NOTE — Progress Notes (Signed)
RT transported patient to room 2M03 with RN. Patient was transported without any apparent complications.

## 2015-06-22 NOTE — ED Notes (Signed)
Family at bedside. 

## 2015-06-22 NOTE — H&P (Signed)
PULMONARY / CRITICAL CARE MEDICINE   Name: Terri Stephens MRN: [id number] DOB: 01-12-1956    ADMISSION DATE:  06/22/2015 CONSULTATION DATE:  06/22/2015  REFERRING MD :  ED  CHIEF COMPLAINT:  COPD Exacerbation   HISTORY OF PRESENT ILLNESS:   Pt is a 16 yof PMH of COPD GOLD Stage IV (on 2L[place] at home), hypothyroidism, depression, and ongoing tobacco abuse (0.5 ppd) presents to [facility] ED with a 48 hour history of progressive SOB and DOE despite home inhaled steroids/nebulizer. Of note, recenty hospitalization for exacerbation from 11/3-11/6. Per caregiver, she has had a steady decline in functional status since discharge and has had progressive SOB and DOE.  Recently seen by pulmonologist on 11/14, Mannam, and started on pred taper for exacerbation. Currently taking 40 mg Prednisone PO per taper. At office visit, CT Chest was also ordered d/t report of hemoptysis. CT chest was performed 11/21, which showed advanced COPD with no evidence of a PE. Pt was advised to come to ED due to SOB on 11/21, but refused to come. Presented SOB acutely worsened in the AM 11/22 and was BIB EMS for respiratory distress. Pt placed on Bipap for ABG 7.23/93. PCCM consulted for admission.   PAST MEDICAL HISTORY :  She  has a past medical history of COPD (chronic obstructive pulmonary disease) (HCC); Asthma; Hypothyroidism; Hyponatremia; and Tobacco abuse.  PAST SURGICAL HISTORY: She  has past surgical history that includes Cardiac catheterization (March 2016).  Allergies  Allergen Reactions  . Bee Venom Anaphylaxis  . Oxycodone Hcl Shortness Of Breath, Itching and Swelling    REACTION: All "codones" in any form    No current facility-administered medications on file prior to encounter.   Current Outpatient Prescriptions on File Prior to Encounter  Medication Sig  . albuterol (PROAIR HFA) 108 (90 BASE) MCG/ACT inhaler Inhale 1-2 puffs into the lungs every 6 (six) hours as needed for wheezing.   Marland Kitchen albuterol  (PROVENTIL) (2.5 MG/3ML) 0.083% nebulizer solution Take 3 mLs (2.5 mg total) by nebulization every 6 (six) hours as needed for wheezing or shortness of breath.  . ALPRAZolam (XANAX) 0.25 MG tablet Take 1 tablet (0.25 mg total) by mouth 3 (three) times daily as needed for anxiety.  Marland Kitchen aspirin EC 81 MG tablet Take 81 mg by mouth daily.  . budesonide-formoterol (SYMBICORT) 160-4.5 MCG/ACT inhaler Inhale 2 puffs into the lungs 2 (two) times daily.  Marland Kitchen guaifenesin (HUMIBID E) 400 MG TABS tablet Take 800 mg by mouth 3 (three) times daily.   Marland Kitchen ibuprofen (ADVIL,MOTRIN) 200 MG tablet Take 800 mg by mouth 2 (two) times daily as needed for headache or moderate pain.  Marland Kitchen ibuprofen (ADVIL,MOTRIN) 800 MG tablet Take 1 tablet (800 mg total) by mouth 3 (three) times daily as needed.  Marland Kitchen levothyroxine (SYNTHROID, LEVOTHROID) 50 MCG tablet Take 50 mcg by mouth daily.  . Multiple Vitamins-Minerals (MULTIVITAMIN & MINERAL PO) Take 1 tablet by mouth daily.  . nicotine (NICODERM CQ - DOSED IN MG/24 HOURS) 14 mg/24hr patch Place 14 mg onto the skin daily.  . predniSONE (DELTASONE) 10 MG tablet 60mg  x 3 days, 50mg  x 3 days, 40mg  x 3 days, 30mg  x 3 days, 20mg  x 3 days, 10mg  x 3 days, then stop  . sertraline (ZOLOFT) 100 MG tablet Take 100 mg by mouth daily.  Marland Kitchen tiotropium (SPIRIVA) 18 MCG inhalation capsule Place 18 mcg into inhaler and inhale daily.  . valACYclovir (VALTREX) 1000 MG tablet Take 1,000 mg by mouth as needed (outbreaks).  FAMILY HISTORY:  Her has no family status information on file.   SOCIAL HISTORY: She  reports that she has been smoking Cigarettes.  She has a 20 pack-year smoking history. She does not have any smokeless tobacco history on file. She reports that she drinks about 1.2 oz of alcohol per week.  REVIEW OF SYSTEMS:   Unable   SUBJECTIVE:  Pt reports "feeling better on BiPap"   VITAL SIGNS: BP 110/69 mmHg  Pulse 79  Temp(Src) 96.3 F (35.7 C) (Axillary)  Resp 16  SpO2  100%  HEMODYNAMICS:    VENTILATOR SETTINGS: Vent Mode:  [-] PCV FiO2 (%):  [40 %-100 %] 40 % Set Rate:  [15 bmp] 15 bmp PEEP:  [5 cmH20] 5 cmH20  INTAKE / OUTPUT:    PHYSICAL EXAMINATION: General:  Chronically ill appearing female resting in bed on BiPap in NAD  Neuro:  Alert, oriented. No focal deficits  HEENT:  NCAT, MMM, No JVD noted  Cardiovascular:  RRR, S1/S2, no rubs/murmurs  Lungs:  Pt with exp wheeze bilaterally. Pt able to speak in full sentences  Abdomen:  Soft, non-tender, non-distended  Musculoskeletal:  Intact Skin:  Intact   LABS:  CBC  Recent Labs Lab 06/22/15 0553  WBC 15.7*  HGB 11.8*  HCT 36.4  PLT 342   Coag's No results for input(s): APTT, INR in the last 168 hours. BMET  Recent Labs Lab 06/22/15 0553  NA 134*  K 4.0  CL 89*  CO2 40*  BUN 10  CREATININE 0.44  GLUCOSE 121*   Electrolytes  Recent Labs Lab 06/22/15 0553  CALCIUM 8.8*   Sepsis Markers No results for input(s): LATICACIDVEN, PROCALCITON, O2SATVEN in the last 168 hours. ABG  Recent Labs Lab 06/22/15 0608 06/22/15 0740  PHART 7.237* 7.333*  PCO2ART 93.7* 90.7*  PO2ART 290* 500.0*   Liver Enzymes  Recent Labs Lab 06/22/15 0553  AST 20  ALT 18  ALKPHOS 67  BILITOT 0.5  ALBUMIN 3.6   Cardiac Enzymes  Recent Labs Lab 06/22/15 0553  TROPONINI <0.03   Glucose No results for input(s): GLUCAP in the last 168 hours.  Imaging Dg Chest Port 1 View  06/22/2015  CLINICAL DATA:  Acute onset of shortness of breath. Initial encounter. EXAM: PORTABLE CHEST 1 VIEW COMPARISON:  Chest radiograph performed 06/15/2015, and CTA of the chest performed 06/21/2015 FINDINGS: The lungs are hyperexpanded, with underlying emphysematous change. The right costophrenic angle is incompletely imaged on this study. Mild bibasilar atelectasis is noted. There is no evidence of pleural effusion or pneumothorax. The cardiomediastinal silhouette is within normal limits. No acute  osseous abnormalities are seen. IMPRESSION: Findings of COPD.  Mild bibasilar atelectasis noted. Electronically Signed   By: Roanna Raider M.D.   On: 06/22/2015 06:07   STUDIES:  CT Chest 11/21> Advanced COPD; No PE   CULTURES: Sputum Cx 11/22>>  ANTIBIOTICS: Levaquin 11/22 >>  SIGNIFICANT EVENTS: 11/22> Pt placed on Bipap for acute on chronic hypercarbic respiratory failure; admitted to ICU    DISCUSSION:  Pt is a 59 yo female with PMH of advanced COPD (GOLD IV; on home Wing) presents to ED on 11/22 with acute on chronic hypercarbic respiratory failure despite recent pred taper (currently on 40 mg) and home nebulizers. Improved on BiPap therapy. Continue BiPap and wean to [place] as tolerated, scheduled BDs, and solumedrol. Levaquin for exacerbation. Concerned that pt has worsening status despite outpatient prednisone taper. Will support while in hospital,pt does not wish to be placed on  life support or receive CPR. May need to consider hospice at discharge.   ASSESSMENT / PLAN:  PULMONARY A:  Acute on Chronic hypercarbic respiratory failure in the setting of AECOPD Tobacco Abuse  P:   Continue BiPap therapy  F/u ABG after 4 hours of BiPap therapy Wean to [place] O2 as tolerated  Scheduled Duonebs Scheduled Budesonide/Brovana  Solumedrol 60 mg Q6   CARDIOVASCULAR A:  No acute  P:  Tele  Avoid hypotension/tachycardia  Continue home ASA 81 mg    RENAL A:   Chronic resp acidosis P:   NS @ 75 ml/hr  F/u Bmet in AM  GASTROINTESTINAL A:   GI prophylaxis  P:   PPI   HEMATOLOGIC A:   No acute  DVT Prophylaxis P: Lovenox  F/u CBC   INFECTIOUS A:   Leukocytosis--> no evidence of clear infection  COPD Exacerbation P:   Sputum cx 11/22 >>   Atbx: Levaquin 11/22 >>   ENDOCRINE A:   H/o Hypothyroidism Hyperglycemia --> Mild; at risk for worsening hyperglycemia d/t steroids  P:   Continue home synthroid  CBGs  Add SSI if needed   NEUROLOGIC A:   No acute   H/o Depression and Anxiety P:   RASS goal: 0 Continue home Celexa 40 mg Avoid sedating meds     FAMILY  - Updates: Friend, who is health care power of attorney, at bedside. Discussed with her at length regarding patient's status and clinical deterioration. Pt has expressed wishes to her regarding not wanting to be put on life support or receive CPR.   - Inter-disciplinary family meet or Palliative Care meeting due by: 11/29   Simonne Martinet ACNP-BC [facility] Pulmonary/Critical Care Pager # [phone] OR # [phone] if no answer    06/22/2015, 8:49 AM

## 2015-06-22 NOTE — ED Notes (Signed)
The patient's daughter called due to the patient being in respiratory distress.  She had used her inhaler without any improvement.  EMS advised the patient has a history of COPD.  EMS also said she was diminished.   EMS started an IV gave her 125mg  of Solumedrol, 2 grams of Mag, 5mg  of Albuterol and .5 of atrovent.  The patient is on 2l/[place] at home.  She is complaining of chest and back pain.

## 2015-06-22 NOTE — ED Notes (Signed)
Pt friend updated on pts status, pt aware of current need for Respiratory support, pts friend identifies herself as the pts POA & questions whether to contact the pts husband who is in [place] to travel here d/t pts status, the family is advised that the decision is hers & the pts to discuss

## 2015-06-22 NOTE — ED Provider Notes (Signed)
CSN: [id number]     Arrival date & time 06/22/15  0548 History   First MD Initiated Contact with Patient 06/22/15 [phone]     Chief Complaint  Patient presents with  . Respiratory Distress    The patient's daughter called due to the patient being in respiratory distress.  EMS advised the patient has a history of COPD.  She had used her inhaler without any improvement.       (Consider location/radiation/quality/duration/timing/severity/associated sxs/prior Treatment) HPI Comments: Presents to the ER for evaluation of respiratory distress. Patient has a history of COPD. Patient reportedly had been having difficulty THROUGH the course of the day yesterday, using her inhaler frequently. She reported no improvement. Overnight her breathing worsened. EMS reported severe respiratory distress requiring CPAP for transportation.  Given Solu-Medrol 125 mg IV, albuterol, Atrovent, magnesium in addition to CPAP and has had minimal improvement.   Past Medical History  Diagnosis Date  . COPD (chronic obstructive pulmonary disease) (HCC)     dx'ed at admission for exacerbation 07/2007.  normal Alpha 1 Antitrypsin level 07/2007  . Asthma     labeled as asthma prior to Dec 2008.  clinically she has COPD  . Hypothyroidism   . Hyponatremia     developed 07/2007 during hospitalization.  resolved  . Tobacco abuse    Past Surgical History  Procedure Laterality Date  . Cardiac catheterization  March 2016   Family History  Problem Relation Age of Onset  . Emphysema Mother   . Cancer - Lung Father   . Heart disease Father   . Heart disease Paternal Grandmother   . Clotting disorder Father   . Cancer Maternal Grandmother     breast   Social History  Substance Use Topics  . Smoking status: Current Every Day Smoker -- 0.50 packs/day for 40 years    Types: Cigarettes  . Smokeless tobacco: Not on file  . Alcohol Use: 1.2 oz/week    0 Standard drinks or equivalent, 2 Cans of beer per week     Comment:  rare   OB History    No data available     Review of Systems  Unable to perform ROS: Acuity of condition      Allergies  Bee venom and Oxycodone hcl  Home Medications   Prior to Admission medications   Medication Sig Start Date End Date Taking? Authorizing Provider  albuterol (PROAIR HFA) 108 (90 BASE) MCG/ACT inhaler Inhale 1-2 puffs into the lungs every 6 (six) hours as needed for wheezing.     Historical Provider, MD  albuterol (PROVENTIL) (2.5 MG/3ML) 0.083% nebulizer solution Take 3 mLs (2.5 mg total) by nebulization every 6 (six) hours as needed for wheezing or shortness of breath. 06/11/15   Coralyn Helling, MD  ALPRAZolam Prudy Feeler) 0.25 MG tablet Take 1 tablet (0.25 mg total) by mouth 3 (three) times daily as needed for anxiety. 06/06/15   Bernadene Person, NP  aspirin EC 81 MG tablet Take 81 mg by mouth daily.    Historical Provider, MD  budesonide-formoterol (SYMBICORT) 160-4.5 MCG/ACT inhaler Inhale 2 puffs into the lungs 2 (two) times daily. 06/06/15   Bernadene Person, NP  guaifenesin (HUMIBID E) 400 MG TABS tablet Take 800 mg by mouth 3 (three) times daily.     Historical Provider, MD  ibuprofen (ADVIL,MOTRIN) 200 MG tablet Take 800 mg by mouth 2 (two) times daily as needed for headache or moderate pain.    Historical Provider, MD  ibuprofen (ADVIL,MOTRIN) 800  MG tablet Take 1 tablet (800 mg total) by mouth 3 (three) times daily as needed. 06/17/15   Praveen Mannam, MD  levothyroxine (SYNTHROID, LEVOTHROID) 50 MCG tablet Take 50 mcg by mouth daily.    Historical Provider, MD  Multiple Vitamins-Minerals (MULTIVITAMIN & MINERAL PO) Take 1 tablet by mouth daily.    Historical Provider, MD  nicotine (NICODERM CQ - DOSED IN MG/24 HOURS) 14 mg/24hr patch Place 14 mg onto the skin daily.    Historical Provider, MD  predniSONE (DELTASONE) 10 MG tablet 60mg  x 3 days, 50mg  x 3 days, 40mg  x 3 days, 30mg  x 3 days, 20mg  x 3 days, 10mg  x 3 days, then stop 06/14/15   Praveen Mannam, MD   sertraline (ZOLOFT) 100 MG tablet Take 100 mg by mouth daily.    Historical Provider, MD  tiotropium (SPIRIVA) 18 MCG inhalation capsule Place 18 mcg into inhaler and inhale daily.    Historical Provider, MD  valACYclovir (VALTREX) 1000 MG tablet Take 1,000 mg by mouth as needed (outbreaks).     Historical Provider, MD   SpO2 95% Physical Exam  Constitutional: She is oriented to person, place, and time. She appears well-developed and well-nourished. She appears distressed.  HENT:  Head: Normocephalic and atraumatic.  Right Ear: Hearing normal.  Left Ear: Hearing normal.  Nose: Nose normal.  Mouth/Throat: Oropharynx is clear and moist and mucous membranes are normal.  Eyes: Conjunctivae and EOM are normal. Pupils are equal, round, and reactive to light.  Neck: Normal range of motion. Neck supple.  Cardiovascular: Regular rhythm, S1 normal and S2 normal.  Exam reveals no gallop and no friction rub.   No murmur heard. Pulmonary/Chest: She is in respiratory distress. She has decreased breath sounds (Breath sounds significantly diminished bilaterally, minimal air movement). She has wheezes (faint at bases). She exhibits no tenderness.  Abdominal: Soft. Normal appearance and bowel sounds are normal. There is no hepatosplenomegaly. There is no tenderness. There is no rebound, no guarding, no tenderness at McBurney's point and negative Murphy's sign. No hernia.  Musculoskeletal: Normal range of motion.  Neurological: She is alert and oriented to person, place, and time. She has normal strength. No cranial nerve deficit or sensory deficit. Coordination normal. GCS eye subscore is 4. GCS verbal subscore is 5. GCS motor subscore is 6.  Skin: Skin is warm, dry and intact. No rash noted. No cyanosis.  Psychiatric: She has a normal mood and affect. Her speech is normal and behavior is normal. Thought content normal.  Nursing note and vitals reviewed.   ED Course  Procedures (including critical care  time) Labs Review Labs Reviewed  CBC WITH DIFFERENTIAL/PLATELET  COMPREHENSIVE METABOLIC PANEL  TROPONIN I  BRAIN NATRIURETIC PEPTIDE  I-STAT ARTERIAL BLOOD GAS, ED    Imaging Review Ct Angio Chest W/cm &/or Wo Cm  06/21/2015  CLINICAL DATA:  Severe back pain. Recent hospitalization for COPD exacerbation. On oxygen for chronic shortness of breath with exertion. EXAM: CT ANGIOGRAPHY CHEST WITH CONTRAST TECHNIQUE: Multidetector CT imaging of the chest was performed using the standard protocol during bolus administration of intravenous contrast. Multiplanar CT image reconstructions and MIPs were obtained to evaluate the vascular anatomy. CONTRAST:  45mL OMNIPAQUE IOHEXOL 350 MG/ML SOLN COMPARISON:  Chest x-ray 06/15/2015 FINDINGS: No filling defects in the pulmonary arteries to suggest pulmonary emboli. Heart is normal size. Aorta is normal caliber. Scattered aortic calcifications. No mediastinal, hilar, or axillary adenopathy. Chest wall soft tissues are unremarkable. Imaging into the upper abdomen shows no  acute findings. Advanced COPD changes. No confluent opacities or suspicious pulmonary nodules. No pleural effusions. No acute bony abnormality. Review of the MIP images confirms the above findings. IMPRESSION: No evidence of pulmonary embolus. Advanced COPD. Aortic atherosclerosis. Electronically Signed   By: Charlett Nose M.D.   On: 06/21/2015 08:52   I have personally reviewed and evaluated these images and lab results as part of my medical decision-making.   EKG Interpretation   Date/Time:  Tuesday June 22 2015 05:49:36 EST Ventricular Rate:  85 PR Interval:  152 QRS Duration: 70 QT Interval:  343 QTC Calculation: 408 R Axis:   22 Text Interpretation:  Sinus rhythm Anteroseptal infarct, age indeterminate  No significant change since last tracing Confirmed by Ourania Hamler  MD,  Shaka Zech [phone]) on 06/22/2015 5:55:59 AM      MDM   Final diagnoses:  None   acute on chronic  respiratory failure COPD  Presents to the emergency department with complaints of shortness of breath. Patient has history of severe COPD. Patient was recently hospitalized for COPD and has had outpatient follow-up with pulmonary since admission. She reports that in the last 24 hours she has acutely worsened. She has been experiencing increased shortness of breath and wheezing not responding to her albuterol inhalers. Overnight symptoms worsened further and she was brought to the ER. She had been maximally treated by EMS with albuterol, Atrovent, Solu-Medrol, magnesium and CPAP. Patient was only moving minimal air at arrival. She was placed on BiPAP. In the ER, breathing treatments continued. X-ray does not show pneumonia. She is tolerating the BiPAP and her oxygenation is significantly improved. She does, however, show significant hypercarbia with blood gas. Case discussed with Dr. Isaiah Serge, critical care. He does feel that the patient would warrant ICU admission and will have critical care staff evaluate the patient in the ER for admission.  CRITICAL CARE Performed by: Gilda Crease   Total critical care time: 35 minutes  Critical care time was exclusive of separately billable procedures and treating other patients.  Critical care was necessary to treat or prevent imminent or life-threatening deterioration.  Critical care was time spent personally by me on the following activities: development of treatment plan with patient and/or surrogate as well as nursing, discussions with consultants, evaluation of patient's response to treatment, examination of patient, obtaining history from patient or surrogate, ordering and performing treatments and interventions, ordering and review of laboratory studies, ordering and review of radiographic studies, pulse oximetry and re-evaluation of patient's condition.     Gilda Crease, MD 06/22/15 [phone]

## 2015-06-22 NOTE — Telephone Encounter (Signed)
Not sure what else we can do. I want to avoid narcotics. Give a prescription for alleve. Ask her to try Salonpas for topical relief. Continue xanax for anxiety. As advised before she can go to ED if the pain worsens.

## 2015-06-22 NOTE — Telephone Encounter (Signed)
Spoke with [employer]. They are currently in the ED at [facility]. Pt's ABG is elevated. Advised her to let us know if they need anything.

## 2015-06-22 NOTE — Progress Notes (Signed)
**  Critical Care Interval Note**  Patient stable on 2L [place]. Vitals stable.  NAD.  Being treated as a COPD exacerbation with steroids and bronchodilators.  Will continue to monitor.  Terri Stephens M. Nadine Counts, DO PGY-2, [facility]

## 2015-06-23 DIAGNOSIS — J9602 Acute respiratory failure with hypercapnia: Secondary | ICD-10-CM | POA: Insufficient documentation

## 2015-06-23 DIAGNOSIS — Z515 Encounter for palliative care: Secondary | ICD-10-CM

## 2015-06-23 LAB — CBC
HEMATOCRIT: 32.3 % — AB (ref 36.0–46.0)
Hemoglobin: 10.4 g/dL — ABNORMAL LOW (ref 12.0–15.0)
MCH: 30.5 pg (ref 26.0–34.0)
MCHC: 32.2 g/dL (ref 30.0–36.0)
MCV: 94.7 fL (ref 78.0–100.0)
PLATELETS: 249 10*3/uL (ref 150–400)
RBC: 3.41 MIL/uL — ABNORMAL LOW (ref 3.87–5.11)
RDW: 12.7 % (ref 11.5–15.5)
WBC: 6.9 10*3/uL (ref 4.0–10.5)

## 2015-06-23 LAB — SEDIMENTATION RATE: Sed Rate: 27 mm/hr — ABNORMAL HIGH (ref 0–22)

## 2015-06-23 LAB — BASIC METABOLIC PANEL
Anion gap: 5 (ref 5–15)
BUN: 10 mg/dL (ref 6–20)
CALCIUM: 9 mg/dL (ref 8.9–10.3)
CO2: 37 mmol/L — AB (ref 22–32)
CREATININE: 0.4 mg/dL — AB (ref 0.44–1.00)
Chloride: 93 mmol/L — ABNORMAL LOW (ref 101–111)
GFR calc Af Amer: >60 mL/min (ref >60)
GFR calc non Af Amer: >60 mL/min (ref >60)
GLUCOSE: 123 mg/dL — AB (ref 65–99)
Potassium: 4.5 mmol/L (ref 3.5–5.1)
Sodium: 135 mmol/L (ref 135–145)

## 2015-06-23 MED ORDER — IBUPROFEN 800 MG PO TABS: 800.0000 mg | ORAL_TABLET | Freq: Three times a day (TID) | ORAL | Status: DC

## 2015-06-23 MED ORDER — GUAIFENESIN ER 600 MG PO TB12
1200.0000 mg | ORAL_TABLET | Freq: Two times a day (BID) | ORAL | Status: DC
  Administered 2015-06-23 – 2015-06-24 (×4): 1200 mg via ORAL
  Filled 2015-06-23 (×5): qty 2

## 2015-06-23 MED ORDER — IBUPROFEN 800 MG PO TABS
800.0000 mg | ORAL_TABLET | Freq: Three times a day (TID) | ORAL | Status: DC
  Administered 2015-06-23 – 2015-06-27 (×13): 800 mg via ORAL
  Filled 2015-06-23 (×13): qty 1

## 2015-06-23 MED ORDER — IBUPROFEN 800 MG PO TABS
800.0000 mg | ORAL_TABLET | Freq: Three times a day (TID) | ORAL | Status: DC | PRN
  Administered 2015-06-23 (×2): 800 mg via ORAL
  Filled 2015-06-23 (×2): qty 4

## 2015-06-23 MED ORDER — METHYLPREDNISOLONE SODIUM SUCC 40 MG IJ SOLR
40.0000 mg | Freq: Two times a day (BID) | INTRAMUSCULAR | Status: AC
  Administered 2015-06-23 – 2015-06-26 (×7): 40 mg via INTRAVENOUS
  Filled 2015-06-23 (×9): qty 1

## 2015-06-23 MED ORDER — BUDESONIDE-FORMOTEROL FUMARATE 160-4.5 MCG/ACT IN AERO
2.0000 | INHALATION_SPRAY | Freq: Two times a day (BID) | RESPIRATORY_TRACT | Status: DC
  Administered 2015-06-23 – 2015-06-27 (×8): 2 via RESPIRATORY_TRACT
  Filled 2015-06-23 (×2): qty 6

## 2015-06-23 NOTE — Consult Note (Signed)
Consultation Note Date: 06/23/2015   Patient Name: Terri Stephens  DOB: 09/13/1955  MRN: [id number]  Age / Sex: 59 y.o., female  PCP: Chilton Greathouse, MD Referring Physician: Oretha Milch, MD  Reason for Consultation: Establishing goals of care    Clinical Assessment/Narrative: Discussed with Terri Stephens her worsening respiratory status and COPD. She is frustrated with the drastic progression since she recently moved back to [place] from [place]. In just a few months she has been hospitalized twice back to back. She is frustrated with the frequent changes with her COPD regimen and questions if this is making her worse. I refer her concerns regarding this to pulmonology. She does acknowledge that this may be all related to disease progression and we did briefly discuss hospice.  She also has fairly severe chronic pain throughout her entire torso and says it is a deep and sometimes sharp pain. Seems to have some relief when coughs up sputum. She cannot identify where this pain begins but says it seems to be like a knot in her chest and up in her throat. We experimented to see if she had relief after breathing treatment and she did have some relief. Question if her tightness d/t COPD is causing muscle tension and pain??   SUMMARY OF RECOMMENDATIONS - Optimize pulmonary function with medical management - Scheduled ibuprofen for pain (she says this helps) - Continue guaifenesin  - Low threshold for albuterol prn to see if this impacts her pain - Heating pad - Flutter valve  Will follow up tomorrow for further recommendations. May also consider tramadol for pain. She is hesitant to try new medications but if needed would like to try them while hospitalized.     Contacts/Participants in Discussion: Primary Decision Maker: Self   Relationship to Patient na HCPOA: yes  Terri Stephens - friend   Code Status/Advance Care  Planning: DNR    Code Status Orders        Start     Ordered   06/22/15 0949  Do not attempt resuscitation (DNR)   Continuous    Question Answer Comment  In the event of cardiac or respiratory ARREST Do not call a "code blue"   In the event of cardiac or respiratory ARREST Do not perform Intubation, CPR, defibrillation or ACLS   In the event of cardiac or respiratory ARREST Use medication by any route, position, wound care, and other measures to relive pain and suffering. May use oxygen, suction and manual treatment of airway obstruction as needed for comfort.      06/22/15 5170    Advance Directive Documentation        Most Recent Value   Type of Advance Directive  Healthcare Power of Attorney   Pre-existing out of facility DNR order (yellow form or pink MOST form)     "MOST" Form in Place?        Other Directives:Advanced Directive  Symptom Management:   See above recs.  Continue xanax prn anxiety.   Palliative Prophylaxis:   Bowel Regimen and Frequent Pain Assessment   Psycho-social/Spiritual:  Support System: Fair Desire for further Chaplaincy support:no Additional Recommendations: Education on Hospice  Prognosis: < 6 months  Discharge Planning: Possibly home with hospice?   Chief Complaint/ Primary Diagnoses: Present on Admission:  . COPD exacerbation (HCC)  I have reviewed the medical record, interviewed the patient and family, and examined the patient. The following aspects are pertinent.  Past Medical History  Diagnosis Date  . COPD (chronic obstructive  pulmonary disease) (HCC)     dx'ed at admission for exacerbation 07/2007.  normal Alpha 1 Antitrypsin level 07/2007  . Asthma     labeled as asthma prior to Dec 2008.  clinically she has COPD  . Hypothyroidism   . Hyponatremia     developed 07/2007 during hospitalization.  resolved  . Tobacco abuse    Social History   Social History  . Marital Status: Married    Spouse Name: N/A  . Number  of Children: N/A  . Years of Education: N/A   Social History Main Topics  . Smoking status: Current Every Day Smoker -- 0.50 packs/day for 40 years    Types: Cigarettes  . Smokeless tobacco: None  . Alcohol Use: 1.2 oz/week    0 Standard drinks or equivalent, 2 Cans of beer per week     Comment: rare  . Drug Use: None  . Sexual Activity: Not Asked   Other Topics Concern  . None   Social History Narrative   Originally from [place]   Has lived in [place] for the last 19yrs   Married, no kids   Husband is in [place]   Spouse smokes   Retired Agricultural engineer   Exposure to [employer] smoke from [employer] burning in [place]   Family History  Problem Relation Age of Onset  . Emphysema Mother   . Cancer - Lung Father   . Heart disease Father   . Heart disease Paternal Grandmother   . Clotting disorder Father   . Cancer Maternal Grandmother     breast   Scheduled Meds: . aspirin  81 mg Oral Daily  . budesonide-formoterol  2 puff Inhalation BID  . guaiFENesin  1,200 mg Oral BID  . ibuprofen  800 mg Oral 3 times per day  . ipratropium-albuterol  3 mL Nebulization Q4H  . levothyroxine  50 mcg Oral Daily  . methylPREDNISolone (SOLU-MEDROL) injection  40 mg Intravenous Q12H  . nicotine  14 mg Transdermal Daily  . sertraline  100 mg Oral Daily   Continuous Infusions: . sodium chloride 10 mL/hr at 06/23/15 0846   PRN Meds:.albuterol, ALPRAZolam Medications Prior to Admission:  Prior to Admission medications   Medication Sig Start Date End Date Taking? Authorizing Provider  albuterol (PROAIR HFA) 108 (90 BASE) MCG/ACT inhaler Inhale 1-2 puffs into the lungs every 6 (six) hours as needed for wheezing.    Yes Historical Provider, MD  albuterol (PROVENTIL) (2.5 MG/3ML) 0.083% nebulizer solution Take 3 mLs (2.5 mg total) by nebulization every 6 (six) hours as needed for wheezing or shortness of breath. 06/11/15  Yes Coralyn Helling, MD  ALPRAZolam (XANAX) 0.25 MG tablet Take 1 tablet (0.25 mg  total) by mouth 3 (three) times daily as needed for anxiety. 06/06/15  Yes Bernadene Person, NP  aspirin EC 81 MG tablet Take 81 mg by mouth daily.   Yes Historical Provider, MD  budesonide-formoterol (SYMBICORT) 160-4.5 MCG/ACT inhaler Inhale 2 puffs into the lungs 2 (two) times daily. 06/06/15  Yes Bernadene Person, NP  guaifenesin (HUMIBID E) 400 MG TABS tablet Take 800 mg by mouth 3 (three) times daily.    Yes Historical Provider, MD  ibuprofen (ADVIL,MOTRIN) 200 MG tablet Take 800 mg by mouth 2 (two) times daily as needed for headache or moderate pain.   Yes Historical Provider, MD  ibuprofen (ADVIL,MOTRIN) 800 MG tablet Take 1 tablet (800 mg total) by mouth 3 (three) times daily as needed. 06/17/15  Yes Chilton Greathouse, MD  levothyroxine (SYNTHROID, LEVOTHROID) 50 MCG tablet Take 50 mcg by mouth daily.   Yes Historical Provider, MD  Multiple Vitamins-Minerals (MULTIVITAMIN & MINERAL PO) Take 1 tablet by mouth daily.   Yes Historical Provider, MD  nicotine (NICODERM CQ - DOSED IN MG/24 HOURS) 14 mg/24hr patch Place 14 mg onto the skin daily.   Yes Historical Provider, MD  predniSONE (DELTASONE) 10 MG tablet 60mg  x 3 days, 50mg  x 3 days, 40mg  x 3 days, 30mg  x 3 days, 20mg  x 3 days, 10mg  x 3 days, then stop 06/14/15  Yes Praveen Mannam, MD  sertraline (ZOLOFT) 100 MG tablet Take 100 mg by mouth daily.   Yes Historical Provider, MD  tiotropium (SPIRIVA) 18 MCG inhalation capsule Place 18 mcg into inhaler and inhale daily.   Yes Historical Provider, MD  valACYclovir (VALTREX) 1000 MG tablet Take 1,000 mg by mouth as needed (outbreaks).    Yes Historical Provider, MD   Allergies  Allergen Reactions  . Bee Venom Anaphylaxis  . Oxycodone Hcl Shortness Of Breath, Itching and Swelling    REACTION: All "codones" in any form    Review of Systems  Constitutional: Positive for activity change.  Respiratory: Positive for chest tightness and shortness of breath.   Musculoskeletal: Positive for  back pain. Negative for myalgias, neck pain and neck stiffness.    Physical Exam  Constitutional: She is oriented to person, place, and time. She appears well-developed.  HENT:  Head: Normocephalic and atraumatic.  Cardiovascular: Normal rate.   Respiratory: Effort normal. No accessory muscle usage. No tachypnea. No respiratory distress.  GI: Normal appearance.  Neurological: She is alert and oriented to person, place, and time.  Psychiatric: She has a normal mood and affect.    Vital Signs: BP 157/75 mmHg  Pulse 93  Temp(Src) 98.8 F (37.1 C) (Oral)  Resp 20  Wt 58.6 kg (129 lb 3 oz)  SpO2 98%  SpO2: SpO2: 98 % O2 Device:SpO2: 98 % O2 Flow Rate: .O2 Flow Rate (L/min): 2 L/min  IO: Intake/output summary:  Intake/Output Summary (Last 24 hours) at 06/23/15 1559 Last data filed at 06/23/15 1000  Gross per 24 hour  Intake 900.67 ml  Output   1250 ml  Net -349.33 ml    LBM: Last BM Date:  (PTA) Baseline Weight: Weight: 58.6 kg (129 lb 3 oz) Most recent weight: Weight: 58.6 kg (129 lb 3 oz)      Palliative Assessment/Data:    Additional Data Reviewed:  CBC:    Component Value Date/Time   WBC 6.9 06/23/2015 0404   HGB 10.4* 06/23/2015 0404   HCT 32.3* 06/23/2015 0404   PLT 249 06/23/2015 0404   MCV 94.7 06/23/2015 0404   NEUTROABS 11.4* 06/22/2015 0553   LYMPHSABS 3.5 06/22/2015 0553   MONOABS 0.8 06/22/2015 0553   EOSABS 0.0 06/22/2015 0553   BASOSABS 0.0 06/22/2015 0553   Comprehensive Metabolic Panel:    Component Value Date/Time   NA 135 06/23/2015 0404   K 4.5 06/23/2015 0404   CL 93* 06/23/2015 0404   CO2 37* 06/23/2015 0404   BUN 10 06/23/2015 0404   CREATININE 0.40* 06/23/2015 0404   GLUCOSE 123* 06/23/2015 0404   CALCIUM 9.0 06/23/2015 0404   AST 20 06/22/2015 0553   ALT 18 06/22/2015 0553   ALKPHOS 67 06/22/2015 0553   BILITOT 0.5 06/22/2015 0553   PROT 6.6 06/22/2015 0553   ALBUMIN 3.6 06/22/2015 0553     Time In: 1435 Time Out:  1600 Time  Total: Greater than 50%  of this time was spent counseling and coordinating care related to the above assessment and plan.  Signed by: Ulice Bold, NP  Ulice Bold, NP  06/23/2015, 3:59 PM  Please contact Palliative Medicine Team phone at [phone] for questions and concerns.

## 2015-06-23 NOTE — Progress Notes (Signed)
Palliative:  Full note to follow. Discussed with Ms. Gruen her worsening respiratory status and COPD. She is frustrated with the drastic progression since she recently moved back to [place] from [place]. In just a few months she has been hospitalized twice back to back. She is frustrated with the frequent changes with her COPD regimen and questions if this is making her worse. I refer her concerns regarding this to pulmonology. She does acknowledge that this may be all related to disease progression and we did briefly discuss hospice.  She also has fairly severe chronic pain throughout her entire torso and says it is a deep and sometimes sharp pain. Seems to have some relief when coughs up sputum. She cannot identify where this pain begins but says it seems to be like a knot in her chest and up in her throat. We experimented to see if she had relief after breathing treatment and she did have some relief. Question if her tightness d/t COPD is causing muscle tension and pain??  - Optimize pulmonary function with medical management - Scheduled ibuprofen for pain (she says this helps) - Continue guaifenesin  - Low threshold for albuterol prn to see if this impacts her pain - Heating pad - Flutter valve  Will follow up tomorrow for further recommendations. May also consider tramadol for pain. She is hesitant to try new medications but if needed would like to try them while hospitalized.   Yong Channel, NP Palliative Medicine Team Pager # [phone] (M-F 8a-5p) Team Phone # [phone] (Nights/Weekends)

## 2015-06-23 NOTE — Progress Notes (Addendum)
PULMONARY / CRITICAL CARE MEDICINE   Name: Nadiya Pieratt MRN: [id number] DOB: [birth year]    ADMISSION DATE:  06/22/2015 CONSULTATION DATE:  06/22/2015  REFERRING MD :  ED  CHIEF COMPLAINT:  COPD Exacerbation   HISTORY OF PRESENT ILLNESS:   Pt is a 59 yof PMH of COPD GOLD Stage IV (on 2LNC at home), hypothyroidism, depression, and ongoing tobacco abuse (0.5 ppd) presents to [facility] with a 48 hour history of progressive SOB and DOE despite home inhaled steroids/nebulizer. Of note, recenty hospitalization for exacerbation from 11/3-11/6. Per caregiver, she has had a steady decline in functional status since discharge and has had progressive SOB and DOE.  Recently seen by pulmonologist on 11/14, Mannam, and started on pred taper for exacerbation. Currently taking 40 mg Prednisone PO per taper. At office visit, CT Chest was also ordered d/t report of hemoptysis. CT chest was performed 11/21, which showed advanced COPD with no evidence of a PE. Pt was advised to come to ED due to SOB on 11/21, but refused to come. Presented SOB acutely worsened in the AM 11/22 and was BIB EMS for respiratory distress. Pt placed on Bipap for ABG 7.23/93. PCCM consulted for admission.   STUDIES:  CT Chest 11/21> Advanced COPD; No PE   SIGNIFICANT EVENTS: 11/22> Pt placed on Bipap for acute on chronic hypercarbic respiratory failure; admitted to ICU    SUBJECTIVE:  Having back pain that is stable. Concerned about anxiety, states she gets anxiety with prednisone. States the anxiety makes her breathing worse.  VITAL SIGNS: BP 140/79 mmHg  Pulse 67  Temp(Src) 97.8 F (36.6 C) (Oral)  Resp 17  Wt 129 lb 3 oz (58.6 kg)  SpO2 100%  HEMODYNAMICS:    VENTILATOR SETTINGS: Nasal cannula currently Vent Mode:  [-] BIPAP FiO2 (%):  [40 %] 40 % Set Rate:  [14 bmp-15 bmp] 14 bmp PEEP:  [5 cmH20] 5 cmH20  INTAKE / OUTPUT: I/O last 3 completed shifts: In: 924.2 [I.V.:924.2] Out: 1425 [Urine:1425]  PHYSICAL  EXAMINATION: General:  Chronically ill appearing female sitting up in bed in NAD, anxious, begins to breather harder as she discusses her anxiety.   Neuro:  Alert, oriented. No focal deficits  HEENT:  NCAT, MMM, No JVD noted  Cardiovascular:  RRR, S1/S2, no rubs/murmurs  Lungs:  Mild Exp wheeze bilaterally. Able to speak in complete sentences, moving air better Abdomen:  +BS, Soft, non-tender, non-distended  Musculoskeletal:  Intact Skin:  Intact   LABS:  CBC  Recent Labs Lab 06/22/15 0553 06/23/15 0404  WBC 15.7* 6.9  HGB 11.8* 10.4*  HCT 36.4 32.3*  PLT 342 249   Coag's No results for input(s): APTT, INR in the last 168 hours. BMET  Recent Labs Lab 06/22/15 0553 06/23/15 0404  NA 134* 135  K 4.0 4.5  CL 89* 93*  CO2 40* 37*  BUN 10 10  CREATININE 0.44 0.40*  GLUCOSE 121* 123*   Electrolytes  Recent Labs Lab 06/22/15 0553 06/22/15 1024 06/23/15 0404  CALCIUM 8.8*  --  9.0  MG  --  2.5*  --   PHOS  --  3.1  --    Sepsis Markers No results for input(s): LATICACIDVEN, PROCALCITON, O2SATVEN in the last 168 hours. ABG  Recent Labs Lab 06/22/15 0608 06/22/15 0740 06/22/15 1136  PHART 7.237* 7.333* 7.389  PCO2ART 93.7* 90.7* 70.0*  PO2ART 290* 500.0* 119.0*   Liver Enzymes  Recent Labs Lab 06/22/15 0553  AST 20  ALT 18  ALKPHOS 67  BILITOT 0.5  ALBUMIN 3.6   Cardiac Enzymes  Recent Labs Lab 06/22/15 0553  TROPONINI <0.03   Glucose  Recent Labs Lab 06/22/15 1133 06/22/15 1519  GLUCAP 113* 106*    Imaging No results found.   DISCUSSION:  Pt is a 59 yo female with PMH of advanced COPD (GOLD IV; on home [place]) presents to ED on 11/22 with acute on chronic hypercarbic respiratory failure despite recent pred taper (currently on 40 mg) and home nebulizers. Improved on BiPap therapy. Concerned that pt has worsening status despite outpatient prednisone taper. Will support while in hospital, pt does not wish to be placed on life support or  receive CPR. May need to consider hospice at discharge.   ASSESSMENT / PLAN:  PULMONARY A:  Acute on Chronic hypercarbic respiratory failure in the setting of AECOPD - improved Tobacco Abuse  P:   Continue [place] as needed.  Dc BiPap PrN  Scheduled Duonebs Scheduled Budesonide/Brovana  Solumedrol 60 mg Q6, reduced Antibiotics as below Add mucinex  CARDIOVASCULAR A:  No acute  P:  Tele dc Avoid hypotension/tachycardia  Continue home ASA 81 mg   RENAL A:   Chronic resp acidosis P:   NS @ 50 ml/hr - kvo  GASTROINTESTINAL A:   GI prophylaxis  P:   Regular diet PPI given steroids  HEMATOLOGIC A:   No acute  DVT Prophylaxis P: Lovenox  Dc if ambulating   INFECTIOUS A:   Leukocytosis--> resolved COPD Exacerbation No evidence infection P:   Sputum cx 11/22 >>  Levaquin 11/22 >>dc  ENDOCRINE A:   H/o Hypothyroidism Hyperglycemia --> Mild; at risk for worsening hyperglycemia d/t steroids  P:   Continue home synthroid  CBGs  Add SSI if needed   NEUROLOGIC A:   No acute  H/o Depression and Anxiety P:   RASS goal: 0 Continue home Celexa 40 mg  Xanax q 8hrs PRN as long as respiratory status is stable Avoid sedating meds  ambulation  FAMILY  - Updates: No family at bedside  - Inter-disciplinary family meet or Palliative Care meeting due by: 11/29  Joanna Puff, MD [facility] Resident  06/23/2015, 7:36 AM   STAFF NOTE: Cindi Carbon, MD FACP have personally reviewed patient's available data, including medical history, events of note, physical examination and test results as part of my evaluation. I have discussed with resident/NP and other care providers such as pharmacist, RN and RRT. In addition, I personally evaluated patient and elicited key findings of: awake,in chair, no distress, speaking full sentences, improved air movement, dc bipap, CT reviewed and pcxr neg pna, infiltrate, no infection likley, rapid resolution, dc  levofloxacin, strep is exposure over last 90 days, not convinced has acute infection, reduce roids, BDers continued, to med surg, add mucinex, no am labs needed  To triad, will not see thur, will return Friday if still in hospital  Mcarthur Rossetti. Tyson Alias, MD, FACP Pgr: [phone] [facility] 06/23/2015 8:40 AM   Back pain still reported, has gotten pred in past and resolved, has arthritic changes wrists, send auto immune blood work May need MRI  Get pall care

## 2015-06-24 ENCOUNTER — Inpatient Hospital Stay (HOSPITAL_COMMUNITY): Payer: BLUE CROSS/BLUE SHIELD

## 2015-06-24 DIAGNOSIS — M549 Dorsalgia, unspecified: Secondary | ICD-10-CM | POA: Insufficient documentation

## 2015-06-24 DIAGNOSIS — M545 Low back pain: Secondary | ICD-10-CM

## 2015-06-24 DIAGNOSIS — F4323 Adjustment disorder with mixed anxiety and depressed mood: Secondary | ICD-10-CM

## 2015-06-24 DIAGNOSIS — Z515 Encounter for palliative care: Secondary | ICD-10-CM

## 2015-06-24 LAB — RHEUMATOID FACTOR: RHEUMATOID FACTOR: 387.7 [IU]/mL — AB (ref 0.0–13.9)

## 2015-06-24 MED ORDER — ONDANSETRON HCL 4 MG/2ML IJ SOLN: 4.0000 mg | Freq: Four times a day (QID) | INTRAMUSCULAR | Status: DC | PRN

## 2015-06-24 MED ORDER — ENOXAPARIN SODIUM 40 MG/0.4ML ~~LOC~~ SOLN
40.0000 mg | SUBCUTANEOUS | Status: DC
  Administered 2015-06-24 – 2015-06-26 (×3): 40 mg via SUBCUTANEOUS
  Filled 2015-06-24 (×3): qty 0.4

## 2015-06-24 MED ORDER — ALBUTEROL SULFATE (2.5 MG/3ML) 0.083% IN NEBU
2.5000 mg | INHALATION_SOLUTION | RESPIRATORY_TRACT | Status: DC | PRN
  Filled 2015-06-24: qty 3

## 2015-06-24 MED ORDER — PANTOPRAZOLE SODIUM 40 MG PO TBEC
40.0000 mg | DELAYED_RELEASE_TABLET | Freq: Every day | ORAL | Status: DC
  Administered 2015-06-24 – 2015-06-27 (×4): 40 mg via ORAL
  Filled 2015-06-24 (×4): qty 1

## 2015-06-24 MED ORDER — ACETAMINOPHEN 500 MG PO TABS
1000.0000 mg | ORAL_TABLET | Freq: Three times a day (TID) | ORAL | Status: DC
  Administered 2015-06-24 – 2015-06-27 (×10): 1000 mg via ORAL
  Filled 2015-06-24 (×10): qty 2

## 2015-06-24 NOTE — Progress Notes (Signed)
PATIENT DETAILS Name: Terri Stephens Age: 59 y.o. Sex: female Date of Birth: 06-23-1956 Admit Date: 06/22/2015 Admitting Physician Oretha Milch, MD [id number] Mannam, MD  Subjective: Breathing improving, complains of mid to low back pain.  Assessment/Plan: Active Problems: Acute on chronic hypercarbic respiratory failure: Secondary to COPD exacerbation. Improved.  Gold Stage IV COPD with exacerbation: Improving, diminished air movement in the bases-but no obvious rhonchi. Continue Solu-Medrol, bronchodilators. Follow clinical course.  Worsening back pain: Obtain plain radiographs of her lumbar/thoracic spine-supportive care. No focal deficits on exam.  Hypothyroidism: Continue levothyroxine  Anxiety/depression: Continue as needed Xanax and Zoloft.  Tobacco abuse: Counseled-continue transdermal nicotine.  History of? Rheumatoid arthritis: Continue steroids-may need outpatient rheumatology evaluation-await serology.  Disposition: Remain inpatient-home the next few days  Antimicrobial agents  See below  Anti-infectives    Start     Dose/Rate Route Frequency Ordered Stop   06/23/15 1000  levofloxacin (LEVAQUIN) IVPB 500 mg  Status:  Discontinued     500 mg 100 mL/hr over 60 Minutes Intravenous Every 24 hours 06/22/15 1008 06/23/15 0846   06/22/15 1000  levofloxacin (LEVAQUIN) IVPB 750 mg  Status:  Discontinued     750 mg 100 mL/hr over 90 Minutes Intravenous Every 24 hours 06/22/15 0952 06/22/15 1008      DVT Prophylaxis: Prophylactic Lovenox   Code Status:  DNR  Family Communication None at bedside  Procedures: None  CONSULTS:  pulmonary/intensive care and Palliative care  Time spent 35 minutes-Greater than 50% of this time was spent in counseling, explanation of diagnosis, planning of further management, and coordination of care.  MEDICATIONS: Scheduled Meds: . aspirin  81 mg Oral Daily  . budesonide-formoterol  2 puff Inhalation  BID  . guaiFENesin  1,200 mg Oral BID  . ibuprofen  800 mg Oral 3 times per day  . ipratropium-albuterol  3 mL Nebulization Q4H  . levothyroxine  50 mcg Oral Daily  . methylPREDNISolone (SOLU-MEDROL) injection  40 mg Intravenous Q12H  . nicotine  14 mg Transdermal Daily  . sertraline  100 mg Oral Daily   Continuous Infusions: . sodium chloride 10 mL/hr at 06/23/15 0846   PRN Meds:.albuterol, ALPRAZolam    PHYSICAL EXAM: Vital signs in last 24 hours: Filed Vitals:   06/23/15 1032 06/23/15 1514 06/23/15 2122 06/24/15 0509  BP: 157/75 148/75 132/68 124/59  Pulse: 93 94 87 72  Temp: 98.8 F (37.1 C) 98.6 F (37 C) 97.7 F (36.5 C) 98 F (36.7 C)  TempSrc: Oral Oral Oral   Resp: 20 18 18 17   Weight:    57.4 kg (126 lb 8.7 oz)  SpO2: 97% 98% 96% 100%    Weight change: -1.2 kg (-2 lb 10.3 oz) Filed Weights   06/23/15 0500 06/24/15 0509  Weight: 58.6 kg (129 lb 3 oz) 57.4 kg (126 lb 8.7 oz)   Body mass index is 19.25 kg/(m^2).   Gen Exam: Awake and alert with clear speech.  Not in any acute distress Neck: Supple, No JVD.  Chest: Diminished entry at bilateral bases-but moving air well in the upper lobes-or rhonchi.  CVS: S1 S2 Regular, no murmurs.  Abdomen: soft, BS +, non tender, non distended.  Extremities: no edema, lower extremities warm to touch. Neurologic: Non Focal.   Skin: No Rash.   Wounds: N/A.    Intake/Output from previous day:  Intake/Output Summary (Last 24 hours) at 06/24/15 1316 Last data filed  at 06/24/15 0510  Gross per 24 hour  Intake    480 ml  Output   1750 ml  Net  -1270 ml     LAB RESULTS: CBC  Recent Labs Lab 06/22/15 0553 06/23/15 0404  WBC 15.7* 6.9  HGB 11.8* 10.4*  HCT 36.4 32.3*  PLT 342 249  MCV 96.8 94.7  MCH 31.4 30.5  MCHC 32.4 32.2  RDW 13.0 12.7  LYMPHSABS 3.5  --   MONOABS 0.8  --   EOSABS 0.0  --   BASOSABS 0.0  --     Chemistries   Recent Labs Lab 06/22/15 0553 06/22/15 1024 06/23/15 0404  NA 134*   --  135  K 4.0  --  4.5  CL 89*  --  93*  CO2 40*  --  37*  GLUCOSE 121*  --  123*  BUN 10  --  10  CREATININE 0.44  --  0.40*  CALCIUM 8.8*  --  9.0  MG  --  2.5*  --     CBG:  Recent Labs Lab 06/22/15 1133 06/22/15 1519  GLUCAP 113* 106*    GFR Estimated Creatinine Clearance: 70.3 mL/min (by C-G formula based on Cr of 0.4).  Coagulation profile No results for input(s): INR, PROTIME in the last 168 hours.  Cardiac Enzymes  Recent Labs Lab 06/22/15 0553  TROPONINI <0.03    Invalid input(s): POCBNP No results for input(s): DDIMER in the last 72 hours. No results for input(s): HGBA1C in the last 72 hours. No results for input(s): CHOL, HDL, LDLCALC, TRIG, CHOLHDL, LDLDIRECT in the last 72 hours. No results for input(s): TSH, T4TOTAL, T3FREE, THYROIDAB in the last 72 hours.  Invalid input(s): FREET3 No results for input(s): VITAMINB12, FOLATE, FERRITIN, TIBC, IRON, RETICCTPCT in the last 72 hours. No results for input(s): LIPASE, AMYLASE in the last 72 hours.  Urine Studies No results for input(s): UHGB, CRYS in the last 72 hours.  Invalid input(s): UACOL, UAPR, USPG, UPH, UTP, UGL, UKET, UBIL, UNIT, UROB, ULEU, UEPI, UWBC, URBC, UBAC, CAST, UCOM, BILUA  MICROBIOLOGY: Recent Results (from the past 240 hour(s))  MRSA PCR Screening     Status: None   Collection Time: 06/22/15 11:34 AM  Result Value Ref Range Status   MRSA by PCR NEGATIVE NEGATIVE Final    Comment:        The GeneXpert MRSA Assay (FDA approved for NASAL specimens only), is one component of a comprehensive MRSA colonization surveillance program. It is not intended to diagnose MRSA infection nor to guide or monitor treatment for MRSA infections.     RADIOLOGY STUDIES/RESULTS: Dg Chest 2 View  06/15/2015  CLINICAL DATA:  Recent hospitalization, generalized chest discomfort since then, history of COPD, asthma, malnutrition, current smoker EXAM: CHEST  2 VIEW COMPARISON:  PA and lateral  chest x-ray of June 03, 2015 FINDINGS: The lungs remain hyperinflated with hemidiaphragm flattening and marked increased AP dimension of the thorax. There is no pneumothorax, pneumomediastinum, or pleural effusion. There is no interstitial or alveolar pneumonia. The heart and pulmonary vascularity are normal. The mediastinum is normal in width. The thoracic vertebral bodies are preserved in height. The ribs appear intact where visualized. IMPRESSION: Marked hyperinflation consistent with known COPD. There is no pneumonia, CHF, nor other acute cardiopulmonary abnormality. The observed bony thorax exhibits no acute abnormality. If the patient's chest discomfort persists and remains unexplained, further evaluation with chest CT scanning may be useful. Electronically Signed   By: David  [place]  M.D.   On: 06/15/2015 14:53   X-ray Chest Pa And Lateral  06/03/2015  CLINICAL DATA:  59 year old female with progressive hemoptysis. Clinical history includes chronic COPD and smoking EXAM: CHEST  2 VIEW COMPARISON:  Prior chest x-ray 11/18/2013 FINDINGS: Cardiac and mediastinal contours are unchanged and remain within normal limits. Atherosclerotic calcification present in the transverse aorta. The lungs are markedly hyperinflated with increased anterior clear space and flattening of the diaphragms. Central bronchitic change and a background of emphysema. No focal airspace consolidation, pleural effusion or pneumothorax. No suspicious mass or nodule. Osseous structures intact and unremarkable. IMPRESSION: Stable chest x-ray with pulmonary parenchymal changes consistent with the clinical history of COPD. No findings to explain hemoptysis. Electronically Signed   By: Malachy Moan M.D.   On: 06/03/2015 19:16   Dg Thoracic Spine 2 View  06/24/2015  CLINICAL DATA:  Mid to low back pain for 2 months. No known injury. Initial encounter. EXAM: THORACIC SPINE 2 VIEWS COMPARISON:  Chest CT 06/21/2015. FINDINGS: The bones  are quite demineralized. There is a mild convex right scoliosis. Mild superior endplate compression deformities at T6 and T7 appear unchanged from prior CT. No acute fracture, paraspinal hematoma or widening of the interpedicular distance identified. The lungs are hyperinflated. IMPRESSION: No apparent acute thoracic spine findings. Stable superior endplate compression deformities at T6 and T7. Electronically Signed   By: Carey Bullocks M.D.   On: 06/24/2015 10:41   Dg Lumbar Spine 2-3 Views  06/24/2015  CLINICAL DATA:  59 year old female with a history of mid and lower back pain for 2 months. No known injury EXAM: LUMBAR SPINE - 2-3 VIEW COMPARISON:  Chest CT 06/21/2015 FINDINGS: Lumbar Spine: Lumbar vertebral elements maintain normal alignment without evidence of subluxation. Anterior view demonstrates mild apex left scoliotic curvature, potentially secondary to splinting or position. No fracture line identified.  No significant disc space loss. Developing facet disease of the L5-S1 level. Calcifications of the abdominal vasculature. IMPRESSION: No acute fracture or malalignment of the lumbar spine. Mild facet disease of the lower lumbar spine. Atherosclerosis. Signed, Yvone Neu. Loreta Ave, DO [facility] Electronically Signed   By: Gilmer Mor D.O.   On: 06/24/2015 10:38   Ct Angio Chest W/cm &/or Wo Cm  06/21/2015  CLINICAL DATA:  Severe back pain. Recent hospitalization for COPD exacerbation. On oxygen for chronic shortness of breath with exertion. EXAM: CT ANGIOGRAPHY CHEST WITH CONTRAST TECHNIQUE: Multidetector CT imaging of the chest was performed using the standard protocol during bolus administration of intravenous contrast. Multiplanar CT image reconstructions and MIPs were obtained to evaluate the vascular anatomy. CONTRAST:  58mL OMNIPAQUE IOHEXOL 350 MG/ML SOLN COMPARISON:  Chest x-ray 06/15/2015 FINDINGS: No filling defects in the  pulmonary arteries to suggest pulmonary emboli. Heart is normal size. Aorta is normal caliber. Scattered aortic calcifications. No mediastinal, hilar, or axillary adenopathy. Chest wall soft tissues are unremarkable. Imaging into the upper abdomen shows no acute findings. Advanced COPD changes. No confluent opacities or suspicious pulmonary nodules. No pleural effusions. No acute bony abnormality. Review of the MIP images confirms the above findings. IMPRESSION: No evidence of pulmonary embolus. Advanced COPD. Aortic atherosclerosis. Electronically Signed   By: Charlett Nose M.D.   On: 06/21/2015 08:52   Dg Esophagus  06/04/2015  CLINICAL DATA:  Aspiration.  Severe COPD. EXAM: ESOPHOGRAM / BARIUM SWALLOW / BARIUM TABLET STUDY TECHNIQUE: Combined double contrast and single contrast examination performed using effervescent crystals, thick barium liquid, and thin barium  liquid. The patient was observed with fluoroscopy swallowing a 13 mm barium sulphate tablet. FLUOROSCOPY TIME:  Fluoroscopy Time:  1 minutes 0 seconds COMPARISON:  None. FINDINGS: The oropharyngeal swallowing mechanisms are normal. The mucosa and motility of the esophagus are normal. A 13 mm barium tablet passed immediately from the mouth to the stomach with no delay. No appreciable hiatal hernia. IMPRESSION: Normal barium esophagram. No aspiration. No hiatal hernia or esophagitis. Electronically Signed   By: Francene Boyers M.D.   On: 06/04/2015 15:44   Dg Chest Port 1 View  06/22/2015  CLINICAL DATA:  Acute onset of shortness of breath. Initial encounter. EXAM: PORTABLE CHEST 1 VIEW COMPARISON:  Chest radiograph performed 06/15/2015, and CTA of the chest performed 06/21/2015 FINDINGS: The lungs are hyperexpanded, with underlying emphysematous change. The right costophrenic angle is incompletely imaged on this study. Mild bibasilar atelectasis is noted. There is no evidence of pleural effusion or pneumothorax. The cardiomediastinal silhouette is  within normal limits. No acute osseous abnormalities are seen. IMPRESSION: Findings of COPD.  Mild bibasilar atelectasis noted. Electronically Signed   By: Roanna Raider M.D.   On: 06/22/2015 06:07    Jeoffrey Massed, MD  [facility] Pager:[phone]  If 7PM-7AM, please contact night-coverage [email or web address] Password [email or web address] 06/24/2015, 1:16 PM   LOS: 2 days

## 2015-06-24 NOTE — Progress Notes (Signed)
RT went to give Terri Stephens her 2000 treatments, pt was in a deep sleep and did not wake up despite stimulation. Pt stable at this time. RT available to administer at later time if needed.

## 2015-06-24 NOTE — Progress Notes (Signed)
Daily Progress Note   Patient Name: Terri Stephens       Date: 06/24/2015 DOB: 1956-04-06  Age: 59 y.o. MRN#: [id number] Attending Physician: Jonetta Osgood, MD Primary Care Physician: Marshell Garfinkel, MD Admit Date: 06/22/2015  Reason for Consultation/Follow-up: Establishing goals of care  Subjective: I met again with Terri Stephens. Her pain is no better and is worse after she just completed radiology studies. She does have some relief with breathing treatments. She says she is still unsure the pain is coming from her back initially. Unclear as well if her breathing worsens because of the pain or before the pain starts - she says there does seem to be a correlation but this would make sense if she is in pain and tense her breathing would become tighter. Xrays have not resulted yet. Rheumatoid factor was elevated - she does say that the pain has improved in the past with steroid bursts but not this time. Continue work up per primary team. We did discuss the role of low dose opioids if needed for pain accepting the risks with her COPD and hypercarbia. She wants to hold this as a last resort. We also discussed hospice and she is open to this if recommended - she is tearful "I didn't think this is where I would be at 59 yo." Support provided. She is very realistic though. Will continue to support.    Length of Stay: 2 days  Current Medications: Scheduled Meds:  . aspirin  81 mg Oral Daily  . budesonide-formoterol  2 puff Inhalation BID  . guaiFENesin  1,200 mg Oral BID  . ibuprofen  800 mg Oral 3 times per day  . ipratropium-albuterol  3 mL Nebulization Q4H  . levothyroxine  50 mcg Oral Daily  . methylPREDNISolone (SOLU-MEDROL) injection  40 mg Intravenous Q12H  . nicotine  14 mg Transdermal  Daily  . sertraline  100 mg Oral Daily    Continuous Infusions: . sodium chloride 10 mL/hr at 06/23/15 0846    PRN Meds: albuterol, ALPRAZolam  Physical Exam: Physical Exam  Constitutional: She is oriented to person, place, and time. She appears well-developed.  HENT:  Head: Normocephalic and atraumatic.  Cardiovascular: Normal rate.   Pulmonary/Chest: No tachypnea. No respiratory distress.  Purse lipped breathing.   Abdominal: Normal appearance.  Neurological: She  is alert and oriented to person, place, and time.  Psychiatric:  Tearful discussing EOL.                Vital Signs: BP 124/59 mmHg  Pulse 72  Temp(Src) 98 F (36.7 C) (Oral)  Resp 17  Wt 57.4 kg (126 lb 8.7 oz)  SpO2 100% SpO2: SpO2: 100 % O2 Device: O2 Device: Nasal Cannula O2 Flow Rate: O2 Flow Rate (L/min): 2 L/min  Intake/output summary:  Intake/Output Summary (Last 24 hours) at 06/24/15 1308 Last data filed at 06/24/15 0510  Gross per 24 hour  Intake    480 ml  Output   1750 ml  Net  -1270 ml   LBM: Last BM Date: 06/21/15 Baseline Weight: Weight: 58.6 kg (129 lb 3 oz) Most recent weight: Weight: 57.4 kg (126 lb 8.7 oz)       Palliative Assessment/Data: PPS: 40%  Additional Data Reviewed: CBC    Component Value Date/Time   WBC 6.9 06/23/2015 0404   RBC 3.41* 06/23/2015 0404   HGB 10.4* 06/23/2015 0404   HCT 32.3* 06/23/2015 0404   PLT 249 06/23/2015 0404   MCV 94.7 06/23/2015 0404   MCH 30.5 06/23/2015 0404   MCHC 32.2 06/23/2015 0404   RDW 12.7 06/23/2015 0404   LYMPHSABS 3.5 06/22/2015 0553   MONOABS 0.8 06/22/2015 0553   EOSABS 0.0 06/22/2015 0553   BASOSABS 0.0 06/22/2015 0553    CMP     Component Value Date/Time   NA 135 06/23/2015 0404   K 4.5 06/23/2015 0404   CL 93* 06/23/2015 0404   CO2 37* 06/23/2015 0404   GLUCOSE 123* 06/23/2015 0404   BUN 10 06/23/2015 0404   CREATININE 0.40* 06/23/2015 0404   CALCIUM 9.0 06/23/2015 0404   PROT 6.6 06/22/2015 0553    ALBUMIN 3.6 06/22/2015 0553   AST 20 06/22/2015 0553   ALT 18 06/22/2015 0553   ALKPHOS 67 06/22/2015 0553   BILITOT 0.5 06/22/2015 0553   GFRNONAA >60 06/23/2015 0404   GFRAA >60 06/23/2015 0404       Problem List:  Patient Active Problem List   Diagnosis Date Noted  . Acute respiratory failure with hypercapnia ([place])   . Protein-calorie malnutrition, severe 06/06/2015  . COPD exacerbation ([place]) 06/03/2015  . HYPOTHYROIDISM 08/12/2007  . OTHER NEUTROPENIA 08/12/2007  . TOBACCO ABUSE 08/12/2007  . CAD 08/12/2007  . ASTHMA 08/12/2007  . C O P D 08/12/2007     Palliative Care Assessment & Plan    1.Code Status:  DNR    Code Status Orders        Start     Ordered   06/22/15 0949  Do not attempt resuscitation (DNR)   Continuous    Question Answer Comment  In the event of cardiac or respiratory ARREST Do not call a "code blue"   In the event of cardiac or respiratory ARREST Do not perform Intubation, CPR, defibrillation or ACLS   In the event of cardiac or respiratory ARREST Use medication by any route, position, wound care, and other measures to relive pain and suffering. May use oxygen, suction and manual treatment of airway obstruction as needed for comfort.      06/22/15 2694    Advance Directive Documentation        Most Recent Value   Type of Advance Directive  Healthcare Power of Attorney   Pre-existing out of facility DNR order (yellow form or pink MOST form)     "MOST"  Form in Place?         2. Goals of Care/Additional Recommendations:  Goal is hopeful for some relief. She wants to be comfortable. Wants to avoid opioids if possible.   Desire for further Chaplaincy support:no  Psycho-social Needs: Education on Hospice  3. Symptom Management:      1. Continue current plan.       2. Pain: Rheumatoid factor elevated?? May consider pain management consult for alternative interventions other than opioids - she would like to avoid this if possible.  Continue aggressive pulmonary toilet and therapy as well. Continue scheduled ibuprofen. Would consider tramadol but she is on SSRI and would not want to risk serotonin syndrome. She also has a difficult time describing the pain.   4. Palliative Prophylaxis:   Bowel Regimen, Delirium Protocol and Frequent Pain Assessment  5. Prognosis: < 6 months  6. Discharge Planning:  Possibly home with hospice.   Thank you for allowing the Palliative Medicine Team to assist in the care of this patient.   Time In: 1035 Time Out: 1115 Total Time 87mn Prolonged Time Billed  no         APershing Proud NP  175/30/0511 1:08 PM  Please contact Palliative Medicine Team phone at [phone] questions and concerns.

## 2015-06-25 DIAGNOSIS — M549 Dorsalgia, unspecified: Secondary | ICD-10-CM

## 2015-06-25 LAB — ANTINUCLEAR ANTIBODIES, IFA: ANTINUCLEAR ANTIBODIES, IFA: NEGATIVE

## 2015-06-25 MED ORDER — GUAIFENESIN ER 600 MG PO TB12
1200.0000 mg | ORAL_TABLET | Freq: Two times a day (BID) | ORAL | Status: DC
  Administered 2015-06-25 – 2015-06-27 (×5): 1200 mg via ORAL
  Filled 2015-06-25 (×6): qty 2

## 2015-06-25 MED ORDER — FENTANYL CITRATE (PF) 100 MCG/2ML IJ SOLN
12.5000 ug | INTRAMUSCULAR | Status: DC | PRN
  Administered 2015-06-25: 12.5 ug via INTRAVENOUS
  Filled 2015-06-25: qty 2

## 2015-06-25 MED ORDER — WHITE PETROLATUM GEL
Status: AC
  Administered 2015-06-25: 0.2
  Filled 2015-06-25: qty 1

## 2015-06-25 NOTE — Evaluation (Signed)
Physical Therapy Evaluation Patient Details Name: Terri Stephens MRN: [id number] DOB: 1956-04-02 Today's Date: 06/25/2015   History of Present Illness  Patient is a 59 y/o female with hx of COPD GOLD Stage IV (on 2LNC at home), hypothyroidism, depression, and ongoing tobacco abuse (0.5 ppd) presents with progressive SOB and DOE despite home inhaled steroids/nebulizer. Recent hospitalization for exacerbation11/3-11/6. Found to have acute on Chronic hypercarbic respiratory failure in the setting of COPD.  Clinical Impression  Patient presents with impaired endurance, DOE, and decreased activity tolerance due to COPD. Tolerated ambulation Mod I with rollator. Discussed energy conservation techniques and pursed lip breathing. Pt has support from family at home. Will need to negotiate steps prior to discharge. Encouraged ambulation a few times/day with family. Will follow acutely to maximize independence and mobility prior to return home.    Follow Up Recommendations Supervision - Intermittent;No PT follow up    Equipment Recommendations  None recommended by PT    Recommendations for Other Services       Precautions / Restrictions Precautions Precautions: None Restrictions Weight Bearing Restrictions: No      Mobility  Bed Mobility               General bed mobility comments: Sitting in chair upon PT arrival.   Transfers Overall transfer level: Needs assistance Equipment used: 4-wheeled walker Transfers: Sit to/from Stand Sit to Stand: Modified independent (Device/Increase time)         General transfer comment: Stood from chair x1.   Ambulation/Gait Ambulation/Gait assistance: Modified independent (Device/Increase time) Ambulation Distance (Feet): 200 Feet (+250') Assistive device: 4-wheeled walker Gait Pattern/deviations: Step-through pattern;Decreased stride length;Trunk flexed   Gait velocity interpretation: <1.8 ft/sec, indicative of risk for recurrent  falls General Gait Details: Slow, guarded gait with increased tension in shoulders. 1 seated rest break. Sp02 remained >94% on 3L/min 02.   Stairs            Wheelchair Mobility    Modified Rankin (Stroke Patients Only)       Balance Overall balance assessment: Needs assistance Sitting-balance support: Feet supported;No upper extremity supported Sitting balance-Leahy Scale: Good     Standing balance support: During functional activity Standing balance-Leahy Scale: Fair Standing balance comment: ABle to donn pants in standing wtihout UE support.                             Pertinent Vitals/Pain Pain Assessment: Faces Faces Pain Scale: Hurts little more Pain Location: back Pain Descriptors / Indicators: Sore;Aching Pain Intervention(s): Monitored during session;Repositioned;Premedicated before session    Home Living Family/patient expects to be discharged to:: Private residence Living Arrangements: Spouse/significant other   Type of Home: House Home Access: Stairs to enter Entrance Stairs-Rails: Right Entrance Stairs-Number of Steps: 5 Home Layout: One level Home Equipment: Environmental consultant - 4 wheels      Prior Function Level of Independence: Needs assistance   Gait / Transfers Assistance Needed: Reports using rollator for mobility in community. Furniture walker for household ambulation. Wears 2-3L/min 02 at home.  ADL's / Homemaking Assistance Needed: Requires assist for ADLs for the last few months from a g/f.        Hand Dominance        Extremity/Trunk Assessment   Upper Extremity Assessment: Defer to OT evaluation           Lower Extremity Assessment: Overall WFL for tasks assessed         Communication  Communication: No difficulties  Cognition Arousal/Alertness: Awake/alert Behavior During Therapy: WFL for tasks assessed/performed Overall Cognitive Status: Within Functional Limits for tasks assessed                       General Comments General comments (skin integrity, edema, etc.): Spouse present during session.    Exercises        Assessment/Plan    PT Assessment Patient needs continued PT services  PT Diagnosis     PT Problem List Cardiopulmonary status limiting activity;Decreased activity tolerance  PT Treatment Interventions Balance training;Gait training;Stair training;Functional mobility training;Therapeutic exercise;Patient/family education   PT Goals (Current goals can be found in the Care Plan section) Acute Rehab PT Goals Patient Stated Goal: to feel better PT Goal Formulation: With patient Time For Goal Achievement: 07/09/15 Potential to Achieve Goals: Good    Frequency Min 3X/week   Barriers to discharge Inaccessible home environment 5 steps to enter home    Co-evaluation               End of Session Equipment Utilized During Treatment: Gait belt;Oxygen Activity Tolerance: Patient tolerated treatment well Patient left: in chair;with call bell/phone within reach;with family/visitor present Nurse Communication: Mobility status         Time: 1501 [id number] PT Time Calculation (min) (ACUTE ONLY): 17 min   Charges:   PT Evaluation $Initial PT Evaluation Tier I: 1 Procedure PT Treatments $Gait Training: 8-22 mins   PT G Codes:        Sabrinia Prien A Jacklin Zwick 06/25/2015, 3:26 PM  Mylo Red, PT, DPT [phone]

## 2015-06-25 NOTE — Progress Notes (Signed)
PATIENT DETAILS Name: Terri Stephens Age: 59 y.o. Sex: female Date of Birth: [birth year] Admit Date: 06/22/2015 Admitting Physician Oretha Milch, MD [id number] Mannam, MD  Subjective: Breathing improving,back pain/bilateral chest pain better  Assessment/Plan: Active Problems: Acute on chronic hypercarbic respiratory failure: Secondary to COPD exacerbation. Improved-follow clinical course  Gold Stage IV COPD with exacerbation: Improving, diminished air movement in the bases-but no obvious rhonchi. Continue Solu-Medrol, bronchodilators. Follow clinical course.  Back/Chest pain: Plain radiographs of her lumbar/thoracic spine negative-non focal exam-suspect musculoskeletal from coughing spells. Appears well controlled with scheduled tylenol and NSAID's.  Hypothyroidism: Continue levothyroxine  Anxiety/depression: Continue as needed Xanax and Zoloft.  Tobacco abuse: Counseled-continue transdermal nicotine.  History of? Rheumatoid arthritis: Continue steroids-could keep on low dose steroids till seen by rheumatology as outpatient   Disposition: Remain inpatient-home in 1-2 days  Antimicrobial agents  See below  Anti-infectives    Start     Dose/Rate Route Frequency Ordered Stop   06/23/15 1000  levofloxacin (LEVAQUIN) IVPB 500 mg  Status:  Discontinued     500 mg 100 mL/hr over 60 Minutes Intravenous Every 24 hours 06/22/15 1008 06/23/15 0846   06/22/15 1000  levofloxacin (LEVAQUIN) IVPB 750 mg  Status:  Discontinued     750 mg 100 mL/hr over 90 Minutes Intravenous Every 24 hours 06/22/15 0952 06/22/15 1008      DVT Prophylaxis: Prophylactic Lovenox   Code Status:  DNR  Family Communication Spouse at bedside  Procedures: None  CONSULTS:  pulmonary/intensive care and Palliative care  Time spent 25 minutes-Greater than 50% of this time was spent in counseling, explanation of diagnosis, planning of further management, and coordination of  care.  MEDICATIONS: Scheduled Meds: . acetaminophen  1,000 mg Oral 3 times per day  . aspirin  81 mg Oral Daily  . budesonide-formoterol  2 puff Inhalation BID  . enoxaparin (LOVENOX) injection  40 mg Subcutaneous Q24H  . guaiFENesin  1,200 mg Oral BID  . ibuprofen  800 mg Oral 3 times per day  . ipratropium-albuterol  3 mL Nebulization Q4H  . levothyroxine  50 mcg Oral Daily  . methylPREDNISolone (SOLU-MEDROL) injection  40 mg Intravenous Q12H  . nicotine  14 mg Transdermal Daily  . pantoprazole  40 mg Oral Q1200  . sertraline  100 mg Oral Daily   Continuous Infusions: . sodium chloride 10 mL/hr at 06/23/15 0846   PRN Meds:.albuterol, ALPRAZolam, ondansetron (ZOFRAN) IV    PHYSICAL EXAM: Vital signs in last 24 hours: Filed Vitals:   06/24/15 2219 06/25/15 0021 06/25/15 0416 06/25/15 0626  BP:    161/81  Pulse:   88 71  Temp:    98.2 F (36.8 C)  TempSrc:    Oral  Resp:   16 16  Weight:    56.8 kg (125 lb 3.5 oz)  SpO2: 96% 98% 96% 100%    Weight change: -0.6 kg (-1 lb 5.2 oz) Filed Weights   06/23/15 0500 06/24/15 0509 06/25/15 0626  Weight: 58.6 kg (129 lb 3 oz) 57.4 kg (126 lb 8.7 oz) 56.8 kg (125 lb 3.5 oz)   Body mass index is 19.04 kg/(m^2).   Gen Exam: Awake and alert with clear speech.  Not in any acute distress Neck: Supple, No JVD.  Chest: Diminished entry at bilateral bases-but moving air well in the upper lobes-or rhonchi.  CVS: S1 S2 Regular, no murmurs.  Abdomen: soft, BS +, non  tender, non distended.  Extremities: no edema, lower extremities warm to touch. Neurologic: Non Focal.   Skin: No Rash.   Wounds: N/A.    Intake/Output from previous day:  Intake/Output Summary (Last 24 hours) at 06/25/15 1215 Last data filed at 06/25/15 0900  Gross per 24 hour  Intake    960 ml  Output   3000 ml  Net  -2040 ml     LAB RESULTS: CBC  Recent Labs Lab 06/22/15 0553 06/23/15 0404  WBC 15.7* 6.9  HGB 11.8* 10.4*  HCT 36.4 32.3*  PLT 342 249   MCV 96.8 94.7  MCH 31.4 30.5  MCHC 32.4 32.2  RDW 13.0 12.7  LYMPHSABS 3.5  --   MONOABS 0.8  --   EOSABS 0.0  --   BASOSABS 0.0  --     Chemistries   Recent Labs Lab 06/22/15 0553 06/22/15 1024 06/23/15 0404  NA 134*  --  135  K 4.0  --  4.5  CL 89*  --  93*  CO2 40*  --  37*  GLUCOSE 121*  --  123*  BUN 10  --  10  CREATININE 0.44  --  0.40*  CALCIUM 8.8*  --  9.0  MG  --  2.5*  --     CBG:  Recent Labs Lab 06/22/15 1133 06/22/15 1519  GLUCAP 113* 106*    GFR Estimated Creatinine Clearance: 69.6 mL/min (by C-G formula based on Cr of 0.4).  Coagulation profile No results for input(s): INR, PROTIME in the last 168 hours.  Cardiac Enzymes  Recent Labs Lab 06/22/15 0553  TROPONINI <0.03    Invalid input(s): POCBNP No results for input(s): DDIMER in the last 72 hours. No results for input(s): HGBA1C in the last 72 hours. No results for input(s): CHOL, HDL, LDLCALC, TRIG, CHOLHDL, LDLDIRECT in the last 72 hours. No results for input(s): TSH, T4TOTAL, T3FREE, THYROIDAB in the last 72 hours.  Invalid input(s): FREET3 No results for input(s): VITAMINB12, FOLATE, FERRITIN, TIBC, IRON, RETICCTPCT in the last 72 hours. No results for input(s): LIPASE, AMYLASE in the last 72 hours.  Urine Studies No results for input(s): UHGB, CRYS in the last 72 hours.  Invalid input(s): UACOL, UAPR, USPG, UPH, UTP, UGL, UKET, UBIL, UNIT, UROB, ULEU, UEPI, UWBC, URBC, UBAC, CAST, UCOM, BILUA  MICROBIOLOGY: Recent Results (from the past 240 hour(s))  MRSA PCR Screening     Status: None   Collection Time: 06/22/15 11:34 AM  Result Value Ref Range Status   MRSA by PCR NEGATIVE NEGATIVE Final    Comment:        The GeneXpert MRSA Assay (FDA approved for NASAL specimens only), is one component of a comprehensive MRSA colonization surveillance program. It is not intended to diagnose MRSA infection nor to guide or monitor treatment for MRSA infections.      RADIOLOGY STUDIES/RESULTS: Dg Chest 2 View  06/15/2015  CLINICAL DATA:  Recent hospitalization, generalized chest discomfort since then, history of COPD, asthma, malnutrition, current smoker EXAM: CHEST  2 VIEW COMPARISON:  PA and lateral chest x-ray of June 03, 2015 FINDINGS: The lungs remain hyperinflated with hemidiaphragm flattening and marked increased AP dimension of the thorax. There is no pneumothorax, pneumomediastinum, or pleural effusion. There is no interstitial or alveolar pneumonia. The heart and pulmonary vascularity are normal. The mediastinum is normal in width. The thoracic vertebral bodies are preserved in height. The ribs appear intact where visualized. IMPRESSION: Marked hyperinflation consistent with known COPD. There  is no pneumonia, CHF, nor other acute cardiopulmonary abnormality. The observed bony thorax exhibits no acute abnormality. If the patient's chest discomfort persists and remains unexplained, further evaluation with chest CT scanning may be useful. Electronically Signed   By: David  [place] M.D.   On: 06/15/2015 14:53   X-ray Chest Pa And Lateral  06/03/2015  CLINICAL DATA:  59 year old female with progressive hemoptysis. Clinical history includes chronic COPD and smoking EXAM: CHEST  2 VIEW COMPARISON:  Prior chest x-ray 11/18/2013 FINDINGS: Cardiac and mediastinal contours are unchanged and remain within normal limits. Atherosclerotic calcification present in the transverse aorta. The lungs are markedly hyperinflated with increased anterior clear space and flattening of the diaphragms. Central bronchitic change and a background of emphysema. No focal airspace consolidation, pleural effusion or pneumothorax. No suspicious mass or nodule. Osseous structures intact and unremarkable. IMPRESSION: Stable chest x-ray with pulmonary parenchymal changes consistent with the clinical history of COPD. No findings to explain hemoptysis. Electronically Signed   By: Malachy Moan M.D.   On: 06/03/2015 19:16   Dg Thoracic Spine 2 View  06/24/2015  CLINICAL DATA:  Mid to low back pain for 2 months. No known injury. Initial encounter. EXAM: THORACIC SPINE 2 VIEWS COMPARISON:  Chest CT 06/21/2015. FINDINGS: The bones are quite demineralized. There is a mild convex right scoliosis. Mild superior endplate compression deformities at T6 and T7 appear unchanged from prior CT. No acute fracture, paraspinal hematoma or widening of the interpedicular distance identified. The lungs are hyperinflated. IMPRESSION: No apparent acute thoracic spine findings. Stable superior endplate compression deformities at T6 and T7. Electronically Signed   By: Carey Bullocks M.D.   On: 06/24/2015 10:41   Dg Lumbar Spine 2-3 Views  06/24/2015  CLINICAL DATA:  59 year old female with a history of mid and lower back pain for 2 months. No known injury EXAM: LUMBAR SPINE - 2-3 VIEW COMPARISON:  Chest CT 06/21/2015 FINDINGS: Lumbar Spine: Lumbar vertebral elements maintain normal alignment without evidence of subluxation. Anterior view demonstrates mild apex left scoliotic curvature, potentially secondary to splinting or position. No fracture line identified.  No significant disc space loss. Developing facet disease of the L5-S1 level. Calcifications of the abdominal vasculature. IMPRESSION: No acute fracture or malalignment of the lumbar spine. Mild facet disease of the lower lumbar spine. Atherosclerosis. Signed, Yvone Neu. Loreta Ave, DO [facility] Radiology Electronically Signed   By: Gilmer Mor D.O.   On: 06/24/2015 10:38   Ct Angio Chest W/cm &/or Wo Cm  06/21/2015  CLINICAL DATA:  Severe back pain. Recent hospitalization for COPD exacerbation. On oxygen for chronic shortness of breath with exertion. EXAM: CT ANGIOGRAPHY CHEST WITH CONTRAST TECHNIQUE: Multidetector CT imaging of the chest was performed using the standard protocol during bolus  administration of intravenous contrast. Multiplanar CT image reconstructions and MIPs were obtained to evaluate the vascular anatomy. CONTRAST:  61mL OMNIPAQUE IOHEXOL 350 MG/ML SOLN COMPARISON:  Chest x-ray 06/15/2015 FINDINGS: No filling defects in the pulmonary arteries to suggest pulmonary emboli. Heart is normal size. Aorta is normal caliber. Scattered aortic calcifications. No mediastinal, hilar, or axillary adenopathy. Chest wall soft tissues are unremarkable. Imaging into the upper abdomen shows no acute findings. Advanced COPD changes. No confluent opacities or suspicious pulmonary nodules. No pleural effusions. No acute bony abnormality. Review of the MIP images confirms the above findings. IMPRESSION: No evidence of pulmonary embolus. Advanced COPD. Aortic atherosclerosis. Electronically Signed   By: Charlett Nose M.D.   On:  06/21/2015 08:52   Dg Esophagus  06/04/2015  CLINICAL DATA:  Aspiration.  Severe COPD. EXAM: ESOPHOGRAM / BARIUM SWALLOW / BARIUM TABLET STUDY TECHNIQUE: Combined double contrast and single contrast examination performed using effervescent crystals, thick barium liquid, and thin barium liquid. The patient was observed with fluoroscopy swallowing a 13 mm barium sulphate tablet. FLUOROSCOPY TIME:  Fluoroscopy Time:  1 minutes 0 seconds COMPARISON:  None. FINDINGS: The oropharyngeal swallowing mechanisms are normal. The mucosa and motility of the esophagus are normal. A 13 mm barium tablet passed immediately from the mouth to the stomach with no delay. No appreciable hiatal hernia. IMPRESSION: Normal barium esophagram. No aspiration. No hiatal hernia or esophagitis. Electronically Signed   By: Francene Boyers M.D.   On: 06/04/2015 15:44   Dg Chest Port 1 View  06/22/2015  CLINICAL DATA:  Acute onset of shortness of breath. Initial encounter. EXAM: PORTABLE CHEST 1 VIEW COMPARISON:  Chest radiograph performed 06/15/2015, and CTA of the chest performed 06/21/2015 FINDINGS: The lungs  are hyperexpanded, with underlying emphysematous change. The right costophrenic angle is incompletely imaged on this study. Mild bibasilar atelectasis is noted. There is no evidence of pleural effusion or pneumothorax. The cardiomediastinal silhouette is within normal limits. No acute osseous abnormalities are seen. IMPRESSION: Findings of COPD.  Mild bibasilar atelectasis noted. Electronically Signed   By: Roanna Raider M.D.   On: 06/22/2015 06:07    Jeoffrey Massed, MD  [facility] Pager:[phone]  If 7PM-7AM, please contact night-coverage [email or web address] Password [email or web address] 06/25/2015, 12:15 PM   LOS: 3 days

## 2015-06-25 NOTE — Progress Notes (Signed)
Daily Progress Note   Patient Name: Terri Stephens       Date: 06/25/2015 DOB: Dec 19, 1955  Age: 59 y.o. MRN#: [id number] Attending Physician: Maretta Bees, MD Primary Care Physician: Chilton Greathouse, MD Admit Date: 06/22/2015  Reason for Consultation/Follow-up: Non pain symptom management  Subjective: Pt up ambulating with PT. Feels better. Conitneued conversation about agents to help manage back opain as well as dyspnea. Pt is very fearful of opioids because of severity of previous reaction to hydrocodone. She describes angioedema, throat swelling with hydrocodone. Ultram discussed but as a synthetic per pharmacy fentanyl or demerol is the safest.  Interval Events: Improving exercise tolerance Length of Stay: 3 days  Current Medications: Scheduled Meds:  . acetaminophen  1,000 mg Oral 3 times per day  . aspirin  81 mg Oral Daily  . budesonide-formoterol  2 puff Inhalation BID  . enoxaparin (LOVENOX) injection  40 mg Subcutaneous Q24H  . guaiFENesin  1,200 mg Oral BID  . ibuprofen  800 mg Oral 3 times per day  . ipratropium-albuterol  3 mL Nebulization Q4H  . levothyroxine  50 mcg Oral Daily  . methylPREDNISolone (SOLU-MEDROL) injection  40 mg Intravenous Q12H  . nicotine  14 mg Transdermal Daily  . pantoprazole  40 mg Oral Q1200  . sertraline  100 mg Oral Daily    Continuous Infusions: . sodium chloride 10 mL/hr at 06/23/15 0846    PRN Meds: albuterol, ALPRAZolam, fentaNYL (SUBLIMAZE) injection, ondansetron (ZOFRAN) IV  Physical Exam: Physical Exam  Constitutional: She is oriented to person, place, and time.  HENT:  Head: Normocephalic.  Neck: Normal range of motion.  Cardiovascular: Normal rate and regular rhythm.   Pulmonary/Chest:  DOE  Abdominal: Soft. Bowel  sounds are normal.  Musculoskeletal: Normal range of motion.  Neurological: She is alert and oriented to person, place, and time.  Skin: Skin is warm and dry.  Psychiatric: She has a normal mood and affect.                Vital Signs: BP 149/76 mmHg  Pulse 92  Temp(Src) 98.6 F (37 C) (Oral)  Resp 18  Wt 56.8 kg (125 lb 3.5 oz)  SpO2 98% SpO2: SpO2: 98 % O2 Device: O2 Device: Nasal Cannula O2 Flow Rate: O2 Flow Rate (L/min): 3 L/min  Intake/output  summary:  Intake/Output Summary (Last 24 hours) at 06/25/15 1646 Last data filed at 06/25/15 0900  Gross per 24 hour  Intake    480 ml  Output   2000 ml  Net  -1520 ml   LBM: Last BM Date: 06/21/15 Baseline Weight: Weight: 58.6 kg (129 lb 3 oz) Most recent weight: Weight: 56.8 kg (125 lb 3.5 oz)       Palliative Assessment/Data: Flowsheet Rows        Most Recent Value   Intake Tab    Referral Department  Hospitalist   Unit at Time of Referral  Med/Surg Unit   Palliative Care Primary Diagnosis  Pulmonary   Date Notified  06/23/15   Palliative Care Type  New Palliative care   Reason for referral  Non-pain Symptom, Clarify Goals of Care   Date of Admission  06/22/15   Date first seen by Palliative Care  06/24/15   # of days Palliative referral response time  1 Day(s)   # of days IP prior to Palliative referral  1   Clinical Assessment    Palliative Performance Scale Score  60%   Pain Max last 24 hours  6   Pain Min Last 24 hours  2   Dyspnea Max Last 24 Hours  6   Dyspnea Min Last 24 hours  2   Nausea Max Last 24 Hours  0   Nausea Min Last 24 Hours  0   Anxiety Max Last 24 Hours  4   Anxiety Min Last 24 Hours  1   Psychosocial & Spiritual Assessment    Palliative Care Outcomes    Patient/Family meeting held?  No   Palliative Care Outcomes  Improved non-pain symptom therapy   Palliative Care follow-up planned  Yes, Facility      Additional Data Reviewed: CBC    Component Value Date/Time   WBC 6.9 06/23/2015  0404   RBC 3.41* 06/23/2015 0404   HGB 10.4* 06/23/2015 0404   HCT 32.3* 06/23/2015 0404   PLT 249 06/23/2015 0404   MCV 94.7 06/23/2015 0404   MCH 30.5 06/23/2015 0404   MCHC 32.2 06/23/2015 0404   RDW 12.7 06/23/2015 0404   LYMPHSABS 3.5 06/22/2015 0553   MONOABS 0.8 06/22/2015 0553   EOSABS 0.0 06/22/2015 0553   BASOSABS 0.0 06/22/2015 0553    CMP     Component Value Date/Time   NA 135 06/23/2015 0404   K 4.5 06/23/2015 0404   CL 93* 06/23/2015 0404   CO2 37* 06/23/2015 0404   GLUCOSE 123* 06/23/2015 0404   BUN 10 06/23/2015 0404   CREATININE 0.40* 06/23/2015 0404   CALCIUM 9.0 06/23/2015 0404   PROT 6.6 06/22/2015 0553   ALBUMIN 3.6 06/22/2015 0553   AST 20 06/22/2015 0553   ALT 18 06/22/2015 0553   ALKPHOS 67 06/22/2015 0553   BILITOT 0.5 06/22/2015 0553   GFRNONAA >60 06/23/2015 0404   GFRAA >60 06/23/2015 0404       Problem List:  Patient Active Problem List   Diagnosis Date Noted  . Back pain   . Palliative care encounter 06/23/2015  . Acute respiratory failure with hypercapnia (HCC)   . Protein-calorie malnutrition, severe 06/06/2015  . COPD exacerbation (HCC) 06/03/2015  . HYPOTHYROIDISM 08/12/2007  . OTHER NEUTROPENIA 08/12/2007  . TOBACCO ABUSE 08/12/2007  . CAD 08/12/2007  . ASTHMA 08/12/2007  . C O P D 08/12/2007     Palliative Care Assessment & Plan    1.Code Status:  DNR    Code Status Orders        Start     Ordered   06/22/15 0949  Do not attempt resuscitation (DNR)   Continuous    Question Answer Comment  In the event of cardiac or respiratory ARREST Do not call a "code blue"   In the event of cardiac or respiratory ARREST Do not perform Intubation, CPR, defibrillation or ACLS   In the event of cardiac or respiratory ARREST Use medication by any route, position, wound care, and other measures to relive pain and suffering. May use oxygen, suction and manual treatment of airway obstruction as needed for comfort.      06/22/15  2947    Advance Directive Documentation        Most Recent Value   Type of Advance Directive  Healthcare Power of Attorney   Pre-existing out of facility DNR order (yellow form or pink MOST form)     "MOST" Form in Place?         2. Goals of Care/Additional Recommendations:  Still discussing home with hospice. Will follow up over the weekend  Desire for further Chaplaincy support:no  3. Symptom Management:      1.Dyspnea: After discussing with pharmacy, recommend fentnayl 12.5 mcg q4 prn iv. If effective and seeing chronic need 2. Pain: New dx of RA. Now having severe back pain. Ibuprofen and scheduled tylenol helping but she is having severe break thru pain. Will try prn iv fenantyl  4. Palliative Prophylaxis:   Bowel Regimen and Frequent Pain Assessment  5. Prognosis: Likely qualifies for in-home hospice. Will continue to process with pt  6. Discharge Planning:  Home; ? With hospice vs. Home health   Thank you for allowing the Palliative Medicine Team to assist in the care of this patient.   Time In: 1630 Time Out: 1700 Total Time 30 min Prolonged Time Billed  no         Irean Hong, NP  06/25/2015, 4:46 PM  Please contact Palliative Medicine Team phone at [phone] for questions and concerns.

## 2015-06-26 LAB — STREP PNEUMONIAE URINARY ANTIGEN: Strep Pneumo Urinary Antigen: NEGATIVE

## 2015-06-26 MED ORDER — HYDROMORPHONE HCL 1 MG/ML IJ SOLN: 0.2500 mg | INTRAMUSCULAR | Status: DC | PRN

## 2015-06-26 MED ORDER — FENTANYL CITRATE (PF) 100 MCG/2ML IJ SOLN
12.5000 ug | INTRAMUSCULAR | Status: DC | PRN
  Administered 2015-06-26 – 2015-06-27 (×2): 12.5 ug via INTRAVENOUS
  Filled 2015-06-26 (×2): qty 2

## 2015-06-26 MED ORDER — POLYETHYLENE GLYCOL 3350 17 G PO PACK
17.0000 g | PACK | Freq: Every day | ORAL | Status: DC
  Administered 2015-06-26 – 2015-06-27 (×2): 17 g via ORAL
  Filled 2015-06-26 (×2): qty 1

## 2015-06-26 MED ORDER — METHOCARBAMOL 750 MG PO TABS
750.0000 mg | ORAL_TABLET | Freq: Four times a day (QID) | ORAL | Status: DC | PRN
  Administered 2015-06-26: 750 mg via ORAL
  Filled 2015-06-26: qty 1

## 2015-06-26 MED ORDER — PREDNISONE 20 MG PO TABS
40.0000 mg | ORAL_TABLET | Freq: Every day | ORAL | Status: DC
  Administered 2015-06-27: 40 mg via ORAL
  Filled 2015-06-26: qty 2

## 2015-06-26 MED ORDER — DIPHENHYDRAMINE HCL 50 MG/ML IJ SOLN: 50.0000 mg | Freq: Four times a day (QID) | INTRAMUSCULAR | Status: DC | PRN

## 2015-06-26 MED ORDER — EPINEPHRINE 0.3 MG/0.3ML IJ SOAJ: 0.3000 mg | Freq: Once | INTRAMUSCULAR | Status: DC

## 2015-06-26 MED ORDER — METHOCARBAMOL 500 MG PO TABS
500.0000 mg | ORAL_TABLET | Freq: Four times a day (QID) | ORAL | Status: DC | PRN
  Administered 2015-06-26: 500 mg via ORAL
  Filled 2015-06-26: qty 1

## 2015-06-26 NOTE — Progress Notes (Signed)
Daily Progress Note   Patient Name: Terri Stephens       Date: 06/26/2015 DOB: [birth year]  Age: 59 y.o. MRN#: [id number] Attending Physician: Maretta Bees, MD Primary Care Physician: Chilton Greathouse, MD Admit Date: 06/22/2015  Reason for Consultation/Follow-up: Non pain symptom management and Pain control  Subjective: Pt reports feeling much better this am. Tried 12.5 Fentanyl 1x overnight and states her pain was a 0/10 and also saw improvement in her breathing. She was straining to have a BM and felt the back pain come back. . Had extensive conversations with [facility] pharmacy as well as [facility] in the community regarding pain medication resources given her severe reaction to hydrocodone. She stated today it took  about 3 days of usage before she began to itch, her tongue and face began to swell after taking hydrocodone. She did not have to seek emergency interventions and stopped drug. We did discuss option of trying low dose iv dilaudid with emergency allergic reaction RX available. Concern that one dose may not tell us whether she is safe to try medication given that pt is likely being dc'd home tomorrow. We did elect to target "pain" vs dyspnea as well since she believes her back pain is inhibiting her breathing. Ordered robaxin 500mg . Pain was a 9-10/10 and came down to a 5-6/10. She is unable to change to PO fentanyl because it is cost prohibitive Interval Events: Improved pain and dyspnea Length of Stay: 4 days  Current Medications: Scheduled Meds:  . acetaminophen  1,000 mg Oral 3 times per day  . aspirin  81 mg Oral Daily  . budesonide-formoterol  2 puff Inhalation BID  . enoxaparin (LOVENOX) injection  40 mg Subcutaneous Q24H  . guaiFENesin  1,200 mg Oral BID  . ibuprofen  800  mg Oral 3 times per day  . ipratropium-albuterol  3 mL Nebulization Q4H  . levothyroxine  50 mcg Oral Daily  . methylPREDNISolone (SOLU-MEDROL) injection  40 mg Intravenous Q12H  . nicotine  14 mg Transdermal Daily  . pantoprazole  40 mg Oral Q1200  . polyethylene glycol  17 g Oral Daily  . sertraline  100 mg Oral Daily    Continuous Infusions: . sodium chloride 10 mL/hr at 06/23/15 0846    PRN Meds: albuterol, ALPRAZolam, methocarbamol, ondansetron (ZOFRAN) IV  Physical  Exam: Physical Exam  Constitutional: She is oriented to person, place, and time. She appears well-nourished.  HENT:  Head: Normocephalic.  Cardiovascular: Normal rate and regular rhythm.   Pulmonary/Chest: Effort normal and breath sounds normal.  Abdominal: Soft. Bowel sounds are normal.  Musculoskeletal: Normal range of motion.  Neurological: She is alert and oriented to person, place, and time.  Skin: Skin is warm and dry.  Psychiatric: She has a normal mood and affect.                Vital Signs: BP 126/75 mmHg  Pulse 75  Temp(Src) 97.5 F (36.4 C) (Oral)  Resp 17  Ht 5\' 8"  (1.727 m)  Wt 59.4 kg (130 lb 15.3 oz)  BMI 19.92 kg/m2  SpO2 98% SpO2: SpO2: 98 % O2 Device: O2 Device: Nasal Cannula O2 Flow Rate: O2 Flow Rate (L/min): 2 L/min  Intake/output summary:  Intake/Output Summary (Last 24 hours) at 06/26/15 1345 Last data filed at 06/26/15 0900  Gross per 24 hour  Intake   1080 ml  Output   2750 ml  Net  -1670 ml   LBM: Last BM Date: 06/25/15 Baseline Weight: Weight: 58.6 kg (129 lb 3 oz) Most recent weight: Weight: 59.4 kg (130 lb 15.3 oz)       Palliative Assessment/Data: Flowsheet Rows        Most Recent Value   Intake Tab    Referral Department  Hospitalist   Unit at Time of Referral  Med/Surg Unit   Palliative Care Primary Diagnosis  Pulmonary   Date Notified  06/23/15   Palliative Care Type  New Palliative care   Reason for referral  Pain, Non-pain Symptom   Date of  Admission  06/22/15   Date first seen by Palliative Care  06/23/15   # of days Palliative referral response time  0 Day(s)   # of days IP prior to Palliative referral  1   Clinical Assessment    Palliative Performance Scale Score  60%   Pain Max last 24 hours  9   Pain Min Last 24 hours  1   Dyspnea Max Last 24 Hours  4   Dyspnea Min Last 24 hours  2   Nausea Max Last 24 Hours  0   Nausea Min Last 24 Hours  0   Anxiety Max Last 24 Hours  4   Anxiety Min Last 24 Hours  2   Psychosocial & Spiritual Assessment    Palliative Care Outcomes    Patient/Family meeting held?  No   Palliative Care Outcomes  Improved pain interventions, Improved non-pain symptom therapy   Palliative Care follow-up planned  Yes, Facility      Additional Data Reviewed: CBC    Component Value Date/Time   WBC 6.9 06/23/2015 0404   RBC 3.41* 06/23/2015 0404   HGB 10.4* 06/23/2015 0404   HCT 32.3* 06/23/2015 0404   PLT 249 06/23/2015 0404   MCV 94.7 06/23/2015 0404   MCH 30.5 06/23/2015 0404   MCHC 32.2 06/23/2015 0404   RDW 12.7 06/23/2015 0404   LYMPHSABS 3.5 06/22/2015 0553   MONOABS 0.8 06/22/2015 0553   EOSABS 0.0 06/22/2015 0553   BASOSABS 0.0 06/22/2015 0553    CMP     Component Value Date/Time   NA 135 06/23/2015 0404   K 4.5 06/23/2015 0404   CL 93* 06/23/2015 0404   CO2 37* 06/23/2015 0404   GLUCOSE 123* 06/23/2015 0404   BUN 10 06/23/2015 0404  CREATININE 0.40* 06/23/2015 0404   CALCIUM 9.0 06/23/2015 0404   PROT 6.6 06/22/2015 0553   ALBUMIN 3.6 06/22/2015 0553   AST 20 06/22/2015 0553   ALT 18 06/22/2015 0553   ALKPHOS 67 06/22/2015 0553   BILITOT 0.5 06/22/2015 0553   GFRNONAA >60 06/23/2015 0404   GFRAA >60 06/23/2015 0404       Problem List:  Patient Active Problem List   Diagnosis Date Noted  . Back pain   . Palliative care encounter 06/23/2015  . Acute respiratory failure with hypercapnia (HCC)   . Protein-calorie malnutrition, severe 06/06/2015  . COPD  exacerbation (HCC) 06/03/2015  . HYPOTHYROIDISM 08/12/2007  . OTHER NEUTROPENIA 08/12/2007  . TOBACCO ABUSE 08/12/2007  . CAD 08/12/2007  . ASTHMA 08/12/2007  . C O P D 08/12/2007     Palliative Care Assessment & Plan    1.Code Status:  DNR    Code Status Orders        Start     Ordered   06/22/15 0949  Do not attempt resuscitation (DNR)   Continuous    Question Answer Comment  In the event of cardiac or respiratory ARREST Do not call a "code blue"   In the event of cardiac or respiratory ARREST Do not perform Intubation, CPR, defibrillation or ACLS   In the event of cardiac or respiratory ARREST Use medication by any route, position, wound care, and other measures to relive pain and suffering. May use oxygen, suction and manual treatment of airway obstruction as needed for comfort.      06/22/15 9169    Advance Directive Documentation        Most Recent Value   Type of Advance Directive  Healthcare Power of Attorney   Pre-existing out of facility DNR order (yellow form or pink MOST form)     "MOST" Form in Place?         2. Goals of Care/Additional Recommendations:  Pt plans to return to [place] in the spring. Does not want to go home on hospice at this point  Desire for further Chaplaincy support:no  3. Symptom Management:      1.Pain: feel that pt would benefit from ascertaing whether there is another PO short acting opoid that she could safely take for moderate to severe pain as well as dyspnea but will have to defer this to out-pt given probable DC tomorrow. Will increase robaxin to 750 q6 prn and cont scheduled tylenol and ibuprofen  4. Palliative Prophylaxis:   Bowel Regimen and Frequent Pain Assessment  5. Prognosis: > 12 months  6. Discharge Planning:  Home with Home Health   Care plan was discussed with Dr. Jerral Ralph  Thank you for allowing the Palliative Medicine Team to assist in the care of this patient.   Time In: 0900 Time Out: 0940 Total  Time 40 min Prolonged Time Billed  no         Irean Hong, NP  06/26/2015, 1:45 PM  Please contact Palliative Medicine Team phone at [phone] for questions and concerns.

## 2015-06-26 NOTE — Progress Notes (Signed)
PATIENT DETAILS Name: Terri Stephens Age: 59 y.o. Sex: female Date of Birth: 1956-03-03 Admit Date: 06/22/2015 Admitting Physician Oretha Milch, MD ZOX:[id number] Mannam, MD  Brief narrative: 59 year old female with history of gold stage IV COPD-on home O2 2 L/m presented with acute respiratory distress-found to have acute on chronic hypercarbic respiratory failure, placed on BiPAP, admitted by PCCM to the intensive care unit. Upon stabilization transferred to the hospitalist service. Hospital course has been complicated by back/chest pain. Palliative care consulted-currently pain regimen being adjusted (claims to have mild angioedema with Vicodin in the past-and is very hesitant to try narcotics). Pain slowly being better controlled, plans are to possibly discharge home in the next 1-2 days if clinical improvement continues.  Subjective: Breathing improving,back pain/bilateral chest pain slowly improving   Assessment/Plan: Active Problems: Acute on chronic hypercarbic respiratory failure: Secondary to COPD exacerbation. Improved-follow clinical course  Gold Stage IV COPD with exacerbation: Improving, diminished air movement in the bases-but no obvious rhonchi. Continue bronchodilators and steroids-but transition to prednisone. Plans are to slowly taper down prednisone-but suspect she is going to require low dose prednisone till she see's a rheumatologist-see below. Follow clinical course.  Back/Chest pain: Plain radiographs of her lumbar/thoracic spine negative-non focal exam-suspect musculoskeletal from coughing spells. Appears well controlled with scheduled tylenol and NSAID's. Patient is very hesitant to try narcotics-p.m. she had a "allergy reaction" with Vicodin in the past-although she claims to have had mild tongue/lip swelling-she never sought any medical attention. Palliative care discussed with pharmacy and placed her on prn IV fentanyl which she is tolerating well.  Palliative care continues to adjust her regimen-plans are to add Robaxin today-if pain still continues-plans are to add low-dose Dilaudid.  Hypothyroidism: Continue levothyroxine  Anxiety/depression: Continue as needed Xanax and Zoloft.  Tobacco abuse: Counseled-continue transdermal nicotine.  History of? Rheumatoid arthritis: Claims to have had arthritis-and was on Celebrex for a number of years while she was in [place]. Continue steroids/NSAIDs-will  keep on low dose steroids till seen by rheumatology as outpatient   Disposition: Remain inpatient-home in 1-2 days  Antimicrobial agents  See below  Anti-infectives    Start     Dose/Rate Route Frequency Ordered Stop   06/23/15 1000  levofloxacin (LEVAQUIN) IVPB 500 mg  Status:  Discontinued     500 mg 100 mL/hr over 60 Minutes Intravenous Every 24 hours 06/22/15 1008 06/23/15 0846   06/22/15 1000  levofloxacin (LEVAQUIN) IVPB 750 mg  Status:  Discontinued     750 mg 100 mL/hr over 90 Minutes Intravenous Every 24 hours 06/22/15 0952 06/22/15 1008      DVT Prophylaxis: Prophylactic Lovenox   Code Status:  DNR  Family Communication Spouse at bedside  Procedures: None  CONSULTS:  pulmonary/intensive care and Palliative care  Time spent 25 minutes-Greater than 50% of this time was spent in counseling, explanation of diagnosis, planning of further management, and coordination of care.  MEDICATIONS: Scheduled Meds: . acetaminophen  1,000 mg Oral 3 times per day  . aspirin  81 mg Oral Daily  . budesonide-formoterol  2 puff Inhalation BID  . enoxaparin (LOVENOX) injection  40 mg Subcutaneous Q24H  . guaiFENesin  1,200 mg Oral BID  . ibuprofen  800 mg Oral 3 times per day  . ipratropium-albuterol  3 mL Nebulization Q4H  . levothyroxine  50 mcg Oral Daily  . methylPREDNISolone (SOLU-MEDROL) injection  40 mg Intravenous Q12H  .  nicotine  14 mg Transdermal Daily  . pantoprazole  40 mg Oral Q1200  . polyethylene glycol  17  g Oral Daily  . sertraline  100 mg Oral Daily   Continuous Infusions: . sodium chloride 10 mL/hr at 06/23/15 0846   PRN Meds:.albuterol, ALPRAZolam, methocarbamol, ondansetron (ZOFRAN) IV    PHYSICAL EXAM: Vital signs in last 24 hours: Filed Vitals:   06/26/15 0443 06/26/15 0913 06/26/15 1248 06/26/15 1352  BP: 126/75   144/74  Pulse: 75   88  Temp: 97.5 F (36.4 C)   99.2 F (37.3 C)  TempSrc: Oral   Oral  Resp: 17   18  Height:      Weight:      SpO2: 100% 97% 98% 98%    Weight change: 2.6 kg (5 lb 11.7 oz) Filed Weights   06/24/15 0509 06/25/15 0626 06/25/15 2154  Weight: 57.4 kg (126 lb 8.7 oz) 56.8 kg (125 lb 3.5 oz) 59.4 kg (130 lb 15.3 oz)   Body mass index is 19.92 kg/(m^2).   Gen Exam: Awake and alert with clear speech.  Not in any acute distress Neck: Supple, No JVD.  Chest: Diminished entry at bilateral bases-but moving air well in the upper lobes-or rhonchi.  CVS: S1 S2 Regular, no murmurs.  Abdomen: soft, BS +, non tender, non distended.  Extremities: no edema, lower extremities warm to touch. Neurologic: Non Focal.   Skin: No Rash.   Wounds: N/A.    Intake/Output from previous day:  Intake/Output Summary (Last 24 hours) at 06/26/15 1551 Last data filed at 06/26/15 1300  Gross per 24 hour  Intake   1320 ml  Output   3050 ml  Net  -1730 ml     LAB RESULTS: CBC  Recent Labs Lab 06/22/15 0553 06/23/15 0404  WBC 15.7* 6.9  HGB 11.8* 10.4*  HCT 36.4 32.3*  PLT 342 249  MCV 96.8 94.7  MCH 31.4 30.5  MCHC 32.4 32.2  RDW 13.0 12.7  LYMPHSABS 3.5  --   MONOABS 0.8  --   EOSABS 0.0  --   BASOSABS 0.0  --     Chemistries   Recent Labs Lab 06/22/15 0553 06/22/15 1024 06/23/15 0404  NA 134*  --  135  K 4.0  --  4.5  CL 89*  --  93*  CO2 40*  --  37*  GLUCOSE 121*  --  123*  BUN 10  --  10  CREATININE 0.44  --  0.40*  CALCIUM 8.8*  --  9.0  MG  --  2.5*  --     CBG:  Recent Labs Lab 06/22/15 1133 06/22/15 1519  GLUCAP  113* 106*    GFR Estimated Creatinine Clearance: 72.8 mL/min (by C-G formula based on Cr of 0.4).  Coagulation profile No results for input(s): INR, PROTIME in the last 168 hours.  Cardiac Enzymes  Recent Labs Lab 06/22/15 0553  TROPONINI <0.03    Invalid input(s): POCBNP No results for input(s): DDIMER in the last 72 hours. No results for input(s): HGBA1C in the last 72 hours. No results for input(s): CHOL, HDL, LDLCALC, TRIG, CHOLHDL, LDLDIRECT in the last 72 hours. No results for input(s): TSH, T4TOTAL, T3FREE, THYROIDAB in the last 72 hours.  Invalid input(s): FREET3 No results for input(s): VITAMINB12, FOLATE, FERRITIN, TIBC, IRON, RETICCTPCT in the last 72 hours. No results for input(s): LIPASE, AMYLASE in the last 72 hours.  Urine Studies No results for input(s): UHGB, CRYS in  the last 72 hours.  Invalid input(s): UACOL, UAPR, USPG, UPH, UTP, UGL, UKET, UBIL, UNIT, UROB, ULEU, UEPI, UWBC, URBC, UBAC, CAST, UCOM, BILUA  MICROBIOLOGY: Recent Results (from the past 240 hour(s))  MRSA PCR Screening     Status: None   Collection Time: 06/22/15 11:34 AM  Result Value Ref Range Status   MRSA by PCR NEGATIVE NEGATIVE Final    Comment:        The GeneXpert MRSA Assay (FDA approved for NASAL specimens only), is one component of a comprehensive MRSA colonization surveillance program. It is not intended to diagnose MRSA infection nor to guide or monitor treatment for MRSA infections.     RADIOLOGY STUDIES/RESULTS: Dg Chest 2 View  06/15/2015  CLINICAL DATA:  Recent hospitalization, generalized chest discomfort since then, history of COPD, asthma, malnutrition, current smoker EXAM: CHEST  2 VIEW COMPARISON:  PA and lateral chest x-ray of June 03, 2015 FINDINGS: The lungs remain hyperinflated with hemidiaphragm flattening and marked increased AP dimension of the thorax. There is no pneumothorax, pneumomediastinum, or pleural effusion. There is no interstitial or  alveolar pneumonia. The heart and pulmonary vascularity are normal. The mediastinum is normal in width. The thoracic vertebral bodies are preserved in height. The ribs appear intact where visualized. IMPRESSION: Marked hyperinflation consistent with known COPD. There is no pneumonia, CHF, nor other acute cardiopulmonary abnormality. The observed bony thorax exhibits no acute abnormality. If the patient's chest discomfort persists and remains unexplained, further evaluation with chest CT scanning may be useful. Electronically Signed   By: David  [place] M.D.   On: 06/15/2015 14:53   X-ray Chest Pa And Lateral  06/03/2015  CLINICAL DATA:  59 year old female with progressive hemoptysis. Clinical history includes chronic COPD and smoking EXAM: CHEST  2 VIEW COMPARISON:  Prior chest x-ray 11/18/2013 FINDINGS: Cardiac and mediastinal contours are unchanged and remain within normal limits. Atherosclerotic calcification present in the transverse aorta. The lungs are markedly hyperinflated with increased anterior clear space and flattening of the diaphragms. Central bronchitic change and a background of emphysema. No focal airspace consolidation, pleural effusion or pneumothorax. No suspicious mass or nodule. Osseous structures intact and unremarkable. IMPRESSION: Stable chest x-ray with pulmonary parenchymal changes consistent with the clinical history of COPD. No findings to explain hemoptysis. Electronically Signed   By: Malachy Moan M.D.   On: 06/03/2015 19:16   Dg Thoracic Spine 2 View  06/24/2015  CLINICAL DATA:  Mid to low back pain for 2 months. No known injury. Initial encounter. EXAM: THORACIC SPINE 2 VIEWS COMPARISON:  Chest CT 06/21/2015. FINDINGS: The bones are quite demineralized. There is a mild convex right scoliosis. Mild superior endplate compression deformities at T6 and T7 appear unchanged from prior CT. No acute fracture, paraspinal hematoma or widening of the interpedicular distance  identified. The lungs are hyperinflated. IMPRESSION: No apparent acute thoracic spine findings. Stable superior endplate compression deformities at T6 and T7. Electronically Signed   By: Carey Bullocks M.D.   On: 06/24/2015 10:41   Dg Lumbar Spine 2-3 Views  06/24/2015  CLINICAL DATA:  59 year old female with a history of mid and lower back pain for 2 months. No known injury EXAM: LUMBAR SPINE - 2-3 VIEW COMPARISON:  Chest CT 06/21/2015 FINDINGS: Lumbar Spine: Lumbar vertebral elements maintain normal alignment without evidence of subluxation. Anterior view demonstrates mild apex left scoliotic curvature, potentially secondary to splinting or position. No fracture line identified.  No significant disc space loss. Developing facet disease of the L5-S1 level.  Calcifications of the abdominal vasculature. IMPRESSION: No acute fracture or malalignment of the lumbar spine. Mild facet disease of the lower lumbar spine. Atherosclerosis. Signed, Yvone Neu. Loreta Ave, DO [facility] Electronically Signed   By: Gilmer Mor D.O.   On: 06/24/2015 10:38   Ct Angio Chest W/cm &/or Wo Cm  06/21/2015  CLINICAL DATA:  Severe back pain. Recent hospitalization for COPD exacerbation. On oxygen for chronic shortness of breath with exertion. EXAM: CT ANGIOGRAPHY CHEST WITH CONTRAST TECHNIQUE: Multidetector CT imaging of the chest was performed using the standard protocol during bolus administration of intravenous contrast. Multiplanar CT image reconstructions and MIPs were obtained to evaluate the vascular anatomy. CONTRAST:  63mL OMNIPAQUE IOHEXOL 350 MG/ML SOLN COMPARISON:  Chest x-ray 06/15/2015 FINDINGS: No filling defects in the pulmonary arteries to suggest pulmonary emboli. Heart is normal size. Aorta is normal caliber. Scattered aortic calcifications. No mediastinal, hilar, or axillary adenopathy. Chest wall soft tissues are unremarkable. Imaging into the upper  abdomen shows no acute findings. Advanced COPD changes. No confluent opacities or suspicious pulmonary nodules. No pleural effusions. No acute bony abnormality. Review of the MIP images confirms the above findings. IMPRESSION: No evidence of pulmonary embolus. Advanced COPD. Aortic atherosclerosis. Electronically Signed   By: Charlett Nose M.D.   On: 06/21/2015 08:52   Dg Esophagus  06/04/2015  CLINICAL DATA:  Aspiration.  Severe COPD. EXAM: ESOPHOGRAM / BARIUM SWALLOW / BARIUM TABLET STUDY TECHNIQUE: Combined double contrast and single contrast examination performed using effervescent crystals, thick barium liquid, and thin barium liquid. The patient was observed with fluoroscopy swallowing a 13 mm barium sulphate tablet. FLUOROSCOPY TIME:  Fluoroscopy Time:  1 minutes 0 seconds COMPARISON:  None. FINDINGS: The oropharyngeal swallowing mechanisms are normal. The mucosa and motility of the esophagus are normal. A 13 mm barium tablet passed immediately from the mouth to the stomach with no delay. No appreciable hiatal hernia. IMPRESSION: Normal barium esophagram. No aspiration. No hiatal hernia or esophagitis. Electronically Signed   By: Francene Boyers M.D.   On: 06/04/2015 15:44   Dg Chest Port 1 View  06/22/2015  CLINICAL DATA:  Acute onset of shortness of breath. Initial encounter. EXAM: PORTABLE CHEST 1 VIEW COMPARISON:  Chest radiograph performed 06/15/2015, and CTA of the chest performed 06/21/2015 FINDINGS: The lungs are hyperexpanded, with underlying emphysematous change. The right costophrenic angle is incompletely imaged on this study. Mild bibasilar atelectasis is noted. There is no evidence of pleural effusion or pneumothorax. The cardiomediastinal silhouette is within normal limits. No acute osseous abnormalities are seen. IMPRESSION: Findings of COPD.  Mild bibasilar atelectasis noted. Electronically Signed   By: Roanna Raider M.D.   On: 06/22/2015 06:07    Jeoffrey Massed, MD  [facility] Pager:[phone]  If 7PM-7AM, please contact night-coverage [email or web address] Password [email or web address] 06/26/2015, 3:51 PM   LOS: 4 days

## 2015-06-27 MED ORDER — PREDNISONE 5 MG PO TABS: ORAL_TABLET | ORAL | Status: DC

## 2015-06-27 MED ORDER — IPRATROPIUM-ALBUTEROL 0.5-2.5 (3) MG/3ML IN SOLN: 3.0000 mL | RESPIRATORY_TRACT | Status: AC

## 2015-06-27 MED ORDER — NICOTINE 14 MG/24HR TD PT24: 14.0000 mg | MEDICATED_PATCH | Freq: Every day | TRANSDERMAL | Status: DC

## 2015-06-27 MED ORDER — MORPHINE SULFATE (PF) 2 MG/ML IV SOLN
1.0000 mg | Freq: Once | INTRAVENOUS | Status: AC
  Administered 2015-06-27: 1 mg via INTRAVENOUS
  Filled 2015-06-27: qty 1

## 2015-06-27 MED ORDER — MORPHINE SULFATE (CONCENTRATE) 10 MG/0.5ML PO SOLN: 10.0000 mg | ORAL | Status: DC | PRN

## 2015-06-27 NOTE — Discharge Instructions (Signed)
Follow with Primary MD Chilton Greathouse, MD in 7 days   Get CBC, CMP, 2 view Chest X ray checked  by Primary MD next visit.    Activity: As tolerated with Full fall precautions use walker/cane & assistance as needed   Disposition Home    Diet: Heart Healthy    For Heart failure patients - Check your Weight same time everyday, if you gain over 2 pounds, or you develop in leg swelling, experience more shortness of breath or chest pain, call your Primary MD immediately. Follow Cardiac Low Salt Diet and 1.5 lit/day fluid restriction.   On your next visit with your primary care physician please Get Medicines reviewed and adjusted.   Please request your Prim.MD to go over all Hospital Tests and Procedure/Radiological results at the follow up, please get all Hospital records sent to your Prim MD by signing hospital release before you go home.   If you experience worsening of your admission symptoms, develop shortness of breath, life threatening emergency, suicidal or homicidal thoughts you must seek medical attention immediately by calling 911 or calling your MD immediately  if symptoms less severe.  You Must read complete instructions/literature along with all the possible adverse reactions/side effects for all the Medicines you take and that have been prescribed to you. Take any new Medicines after you have completely understood and accpet all the possible adverse reactions/side effects.   Do not drive, operating heavy machinery, perform activities at heights, swimming or participation in water activities or provide baby sitting services if your were admitted for syncope or siezures until you have seen by Primary MD or a Neurologist and advised to do so again.  Do not drive when taking Pain medications.    Do not take more than prescribed Pain, Sleep and Anxiety Medications  Special Instructions: If you have smoked or chewed Tobacco  in the last 2 yrs please stop smoking, stop any regular  Alcohol  and or any Recreational drug use.  Wear Seat belts while driving.   Please note  You were cared for by a hospitalist during your hospital stay. If you have any questions about your discharge medications or the care you received while you were in the hospital after you are discharged, you can call the unit and asked to speak with the hospitalist on call if the hospitalist that took care of you is not available. Once you are discharged, your primary care physician will handle any further medical issues. Please note that NO REFILLS for any discharge medications will be authorized once you are discharged, as it is imperative that you return to your primary care physician (or establish a relationship with a primary care physician if you do not have one) for your aftercare needs so that they can reassess your need for medications and monitor your lab values.

## 2015-06-27 NOTE — Discharge Summary (Addendum)
Terri Stephens, is a 59 y.o. female  DOB 25-Feb-1956  MRN [id number].  Admission date:  06/22/2015  Admitting Physician  Oretha Milch, MD  Discharge Date:  06/27/2015   Primary MD  Chilton Greathouse, MD  Recommendations for primary care physician for things to follow:   Monitor COPD outpatient. Needs close outpatient rheumatology and pulmonary follow-up.   Admission Diagnosis  COPD exacerbation (HCC) [J44.1] Acute respiratory failure with hypercapnia (HCC) [J96.02]   Discharge Diagnosis  COPD exacerbation (HCC) [J44.1] Acute respiratory failure with hypercapnia (HCC) [J96.02]     Active Problems:   COPD exacerbation (HCC)   Acute respiratory failure with hypercapnia (HCC)   Palliative care encounter   Back pain      Past Medical History  Diagnosis Date  . COPD (chronic obstructive pulmonary disease) (HCC)     dx'ed at admission for exacerbation 07/2007.  normal Alpha 1 Antitrypsin level 07/2007  . Asthma     labeled as asthma prior to Dec 2008.  clinically she has COPD  . Hypothyroidism   . Hyponatremia     developed 07/2007 during hospitalization.  resolved  . Tobacco abuse     Past Surgical History  Procedure Laterality Date  . Cardiac catheterization  March 2016       HPI   :    59 year old female with history of gold stage IV COPD-on home O2 2 L/m presented with acute respiratory distress-found to have acute on chronic hypercarbic respiratory failure, placed on BiPAP, admitted by PCCM to the intensive care unit. Upon stabilization transferred to the hospitalist service. Hospital course has been complicated by back/chest pain. Palliative care consulted-currently pain regimen being adjusted (claims to have mild angioedema with Vicodin in the past-and is very hesitant to try narcotics). Pain slowly  being better controlled, plans are to possibly discharge home in the next 1-2 days if clinical improvement continues.     Hospital Course:     Acute on chronic hypercarbic and hypoxic respiratory failure: Secondary to COPD exacerbation. Has history of Gold Stage IV COPD : On home oxygen, still smoking counseled to quit, responded well to IV steroids, now no wheezing on exam and close to baseline, is not experiencing any shortness of breath. Will be placed on steroid taper, continue nebulizer treatments as before along with outpatient pulmonary follow-up post discharge.  Severe chronic Back/Chest pain: Plain radiographs of her lumbar/thoracic spine negative-non focal exam-suspect musculoskeletal from coughing spells. Appears well controlled with scheduled tylenol and NSAID's. Patient was very hesitant in trying narcotics as she had a adverse reaction to Vicodin in the past, she tolerated both fentanyl and morphine while here, placed on oral Roxanol. She received IV morphine and fentanyl both without any discomfort. Will be placed on low-dose Roxanol and discharged.  She likely has undiagnosed rheumatoid arthritis, have requested her to follow with rheumatology post discharge. Rheumatoid factor was elevated to 237.   Hypothyroidism: Continue levothyroxine  Anxiety/depression: Continue as needed Xanax and Zoloft.  Tobacco abuse: Counseled-continue transdermal nicotine.  History  of? Rheumatoid arthritis: Claims to have had arthritis-and was on Celebrex for a number of years while she was in [place]. Continue steroids/NSAIDs-will keep on low dose steroids till seen by rheumatology as outpatient.    Discharge Condition: Stable  Follow UP  Follow-up Information    Follow up with Chilton Greathouse, MD. Schedule an appointment as soon as possible for a visit in 1 week.   Specialty:  Pulmonary Disease   Contact information:   [address] [place] [address] [phone]        Follow up with DEVESHWAR,SHAILI B, MD. Schedule an appointment as soon as possible for a visit in 1 week.   Specialty:  Rheumatology   Contact information:   [address] [place] [address] [phone]        Consults obtained -  Pall care  Diet and Activity recommendation: See Discharge Instructions below  Discharge Instructions           Discharge Instructions    Discharge instructions    Complete by:  As directed   Follow with Primary MD Chilton Greathouse, MD in 7 days   Get CBC, CMP, 2 view Chest X ray checked  by Primary MD next visit.    Activity: As tolerated with Full fall precautions use walker/cane & assistance as needed   Disposition Home    Diet: Heart Healthy    For Heart failure patients - Check your Weight same time everyday, if you gain over 2 pounds, or you develop in leg swelling, experience more shortness of breath or chest pain, call your Primary MD immediately. Follow Cardiac Low Salt Diet and 1.5 lit/day fluid restriction.   On your next visit with your primary care physician please Get Medicines reviewed and adjusted.   Please request your Prim.MD to go over all Hospital Tests and Procedure/Radiological results at the follow up, please get all Hospital records sent to your Prim MD by signing hospital release before you go home.   If you experience worsening of your admission symptoms, develop shortness of breath, life threatening emergency, suicidal or homicidal thoughts you must seek medical attention immediately by calling 911 or calling your MD immediately  if symptoms less severe.  You Must read complete instructions/literature along with all the possible adverse reactions/side effects for all the Medicines you take and that have been prescribed to you. Take any new Medicines after you have completely understood and accpet all the possible adverse reactions/side effects.   Do not drive, operating heavy machinery, perform activities at  heights, swimming or participation in water activities or provide baby sitting services if your were admitted for syncope or siezures until you have seen by Primary MD or a Neurologist and advised to do so again.  Do not drive when taking Pain medications.    Do not take more than prescribed Pain, Sleep and Anxiety Medications  Special Instructions: If you have smoked or chewed Tobacco  in the last 2 yrs please stop smoking, stop any regular Alcohol  and or any Recreational drug use.  Wear Seat belts while driving.   Please note  You were cared for by a hospitalist during your hospital stay. If you have any questions about your discharge medications or the care you received while you were in the hospital after you are discharged, you can call the unit and asked to speak with the hospitalist on call if the hospitalist that took care of you is not available. Once you are  discharged, your primary care physician will handle any further medical issues. Please note that NO REFILLS for any discharge medications will be authorized once you are discharged, as it is imperative that you return to your primary care physician (or establish a relationship with a primary care physician if you do not have one) for your aftercare needs so that they can reassess your need for medications and monitor your lab values.     Increase activity slowly    Complete by:  As directed              Discharge Medications       Medication List    STOP taking these medications        predniSONE 10 MG tablet  Commonly known as:  DELTASONE  Replaced by:  predniSONE 5 MG tablet      TAKE these medications        ALPRAZolam 0.25 MG tablet  Commonly known as:  XANAX  Take 1 tablet (0.25 mg total) by mouth 3 (three) times daily as needed for anxiety.     aspirin EC 81 MG tablet  Take 81 mg by mouth daily.     budesonide-formoterol 160-4.5 MCG/ACT inhaler  Commonly known as:  SYMBICORT  Inhale 2 puffs into the  lungs 2 (two) times daily.     guaifenesin 400 MG Tabs tablet  Commonly known as:  HUMIBID E  Take 800 mg by mouth 3 (three) times daily.     ibuprofen 200 MG tablet  Commonly known as:  ADVIL,MOTRIN  Take 800 mg by mouth 2 (two) times daily as needed for headache or moderate pain.     ipratropium-albuterol 0.5-2.5 (3) MG/3ML Soln  Commonly known as:  DUONEB  Take 3 mLs by nebulization every 4 (four) hours.     levothyroxine 50 MCG tablet  Commonly known as:  SYNTHROID, LEVOTHROID  Take 50 mcg by mouth daily.     morphine CONCENTRATE 10 MG/0.5ML Soln concentrated solution  Take 0.5 mLs (10 mg total) by mouth every 3 (three) hours as needed for moderate pain or severe pain.     MULTIVITAMIN & MINERAL PO  Take 1 tablet by mouth daily.     nicotine 14 mg/24hr patch  Commonly known as:  NICODERM CQ - dosed in mg/24 hours  Place 1 patch (14 mg total) onto the skin daily.     predniSONE 5 MG tablet  Commonly known as:  DELTASONE  Label  & dispense according to the schedule below. 10 Pills PO for 3 days then, 8 Pills PO for 3 days, 6 Pills PO for 3 days, 4 Pills PO for 3 days, then continue taking 10 mg prednisone daily as before to you see your rheumatologist and lung doctor.     PROAIR HFA 108 (90 BASE) MCG/ACT inhaler  Generic drug:  albuterol  Inhale 1-2 puffs into the lungs every 6 (six) hours as needed for wheezing.     albuterol (2.5 MG/3ML) 0.083% nebulizer solution  Commonly known as:  PROVENTIL  Take 3 mLs (2.5 mg total) by nebulization every 6 (six) hours as needed for wheezing or shortness of breath.     sertraline 100 MG tablet  Commonly known as:  ZOLOFT  Take 100 mg by mouth daily.     tiotropium 18 MCG inhalation capsule  Commonly known as:  SPIRIVA  Place 18 mcg into inhaler and inhale daily.     valACYclovir 1000 MG tablet  Commonly known as:  VALTREX  Take 1,000 mg by mouth as needed (outbreaks).        Major procedures and Radiology Reports - PLEASE  review detailed and final reports for all details, in brief -       Dg Chest 2 View  06/15/2015  CLINICAL DATA:  Recent hospitalization, generalized chest discomfort since then, history of COPD, asthma, malnutrition, current smoker EXAM: CHEST  2 VIEW COMPARISON:  PA and lateral chest x-ray of June 03, 2015 FINDINGS: The lungs remain hyperinflated with hemidiaphragm flattening and marked increased AP dimension of the thorax. There is no pneumothorax, pneumomediastinum, or pleural effusion. There is no interstitial or alveolar pneumonia. The heart and pulmonary vascularity are normal. The mediastinum is normal in width. The thoracic vertebral bodies are preserved in height. The ribs appear intact where visualized. IMPRESSION: Marked hyperinflation consistent with known COPD. There is no pneumonia, CHF, nor other acute cardiopulmonary abnormality. The observed bony thorax exhibits no acute abnormality. If the patient's chest discomfort persists and remains unexplained, further evaluation with chest CT scanning may be useful. Electronically Signed   By: David  [place] M.D.   On: 06/15/2015 14:53   X-ray Chest Pa And Lateral  06/03/2015  CLINICAL DATA:  59 year old female with progressive hemoptysis. Clinical history includes chronic COPD and smoking EXAM: CHEST  2 VIEW COMPARISON:  Prior chest x-ray 11/18/2013 FINDINGS: Cardiac and mediastinal contours are unchanged and remain within normal limits. Atherosclerotic calcification present in the transverse aorta. The lungs are markedly hyperinflated with increased anterior clear space and flattening of the diaphragms. Central bronchitic change and a background of emphysema. No focal airspace consolidation, pleural effusion or pneumothorax. No suspicious mass or nodule. Osseous structures intact and unremarkable. IMPRESSION: Stable chest x-ray with pulmonary parenchymal changes consistent with the clinical history of COPD. No findings to explain hemoptysis.  Electronically Signed   By: Malachy Moan M.D.   On: 06/03/2015 19:16   Dg Thoracic Spine 2 View  06/24/2015  CLINICAL DATA:  Mid to low back pain for 2 months. No known injury. Initial encounter. EXAM: THORACIC SPINE 2 VIEWS COMPARISON:  Chest CT 06/21/2015. FINDINGS: The bones are quite demineralized. There is a mild convex right scoliosis. Mild superior endplate compression deformities at T6 and T7 appear unchanged from prior CT. No acute fracture, paraspinal hematoma or widening of the interpedicular distance identified. The lungs are hyperinflated. IMPRESSION: No apparent acute thoracic spine findings. Stable superior endplate compression deformities at T6 and T7. Electronically Signed   By: Carey Bullocks M.D.   On: 06/24/2015 10:41   Dg Lumbar Spine 2-3 Views  06/24/2015  CLINICAL DATA:  59 year old female with a history of mid and lower back pain for 2 months. No known injury EXAM: LUMBAR SPINE - 2-3 VIEW COMPARISON:  Chest CT 06/21/2015 FINDINGS: Lumbar Spine: Lumbar vertebral elements maintain normal alignment without evidence of subluxation. Anterior view demonstrates mild apex left scoliotic curvature, potentially secondary to splinting or position. No fracture line identified.  No significant disc space loss. Developing facet disease of the L5-S1 level. Calcifications of the abdominal vasculature. IMPRESSION: No acute fracture or malalignment of the lumbar spine. Mild facet disease of the lower lumbar spine. Atherosclerosis. Signed, Yvone Neu. Loreta Ave, DO [facility] Electronically Signed   By: Gilmer Mor D.O.   On: 06/24/2015 10:38   Ct Angio Chest W/cm &/or Wo Cm  06/21/2015  CLINICAL DATA:  Severe back pain. Recent hospitalization for COPD exacerbation. On oxygen for chronic shortness  of breath with exertion. EXAM: CT ANGIOGRAPHY CHEST WITH CONTRAST TECHNIQUE: Multidetector CT imaging of the chest was performed using the  standard protocol during bolus administration of intravenous contrast. Multiplanar CT image reconstructions and MIPs were obtained to evaluate the vascular anatomy. CONTRAST:  72mL OMNIPAQUE IOHEXOL 350 MG/ML SOLN COMPARISON:  Chest x-ray 06/15/2015 FINDINGS: No filling defects in the pulmonary arteries to suggest pulmonary emboli. Heart is normal size. Aorta is normal caliber. Scattered aortic calcifications. No mediastinal, hilar, or axillary adenopathy. Chest wall soft tissues are unremarkable. Imaging into the upper abdomen shows no acute findings. Advanced COPD changes. No confluent opacities or suspicious pulmonary nodules. No pleural effusions. No acute bony abnormality. Review of the MIP images confirms the above findings. IMPRESSION: No evidence of pulmonary embolus. Advanced COPD. Aortic atherosclerosis. Electronically Signed   By: Charlett Nose M.D.   On: 06/21/2015 08:52   Dg Esophagus  06/04/2015  CLINICAL DATA:  Aspiration.  Severe COPD. EXAM: ESOPHOGRAM / BARIUM SWALLOW / BARIUM TABLET STUDY TECHNIQUE: Combined double contrast and single contrast examination performed using effervescent crystals, thick barium liquid, and thin barium liquid. The patient was observed with fluoroscopy swallowing a 13 mm barium sulphate tablet. FLUOROSCOPY TIME:  Fluoroscopy Time:  1 minutes 0 seconds COMPARISON:  None. FINDINGS: The oropharyngeal swallowing mechanisms are normal. The mucosa and motility of the esophagus are normal. A 13 mm barium tablet passed immediately from the mouth to the stomach with no delay. No appreciable hiatal hernia. IMPRESSION: Normal barium esophagram. No aspiration. No hiatal hernia or esophagitis. Electronically Signed   By: Francene Boyers M.D.   On: 06/04/2015 15:44   Dg Chest Port 1 View  06/22/2015  CLINICAL DATA:  Acute onset of shortness of breath. Initial encounter. EXAM: PORTABLE CHEST 1 VIEW COMPARISON:  Chest radiograph performed 06/15/2015, and CTA of the chest performed  06/21/2015 FINDINGS: The lungs are hyperexpanded, with underlying emphysematous change. The right costophrenic angle is incompletely imaged on this study. Mild bibasilar atelectasis is noted. There is no evidence of pleural effusion or pneumothorax. The cardiomediastinal silhouette is within normal limits. No acute osseous abnormalities are seen. IMPRESSION: Findings of COPD.  Mild bibasilar atelectasis noted. Electronically Signed   By: Roanna Raider M.D.   On: 06/22/2015 06:07    Micro Results      Recent Results (from the past 240 hour(s))  MRSA PCR Screening     Status: None   Collection Time: 06/22/15 11:34 AM  Result Value Ref Range Status   MRSA by PCR NEGATIVE NEGATIVE Final    Comment:        The GeneXpert MRSA Assay (FDA approved for NASAL specimens only), is one component of a comprehensive MRSA colonization surveillance program. It is not intended to diagnose MRSA infection nor to guide or monitor treatment for MRSA infections.        Today   Subjective    Terri Stephens today has no headache,no chest abdominal pain,no new weakness tingling or numbness, feels much better wants to go home today.     Objective   Blood pressure 113/68, pulse 81, temperature 97.7 F (36.5 C), temperature source Axillary, resp. rate 20, height 5\' 8"  (1.727 m), weight 56.8 kg (125 lb 3.5 oz), SpO2 98 %.   Intake/Output Summary (Last 24 hours) at 06/27/15 1323 Last data filed at 06/27/15 0900  Gross per 24 hour  Intake   1320 ml  Output   2250 ml  Net   -930 ml  Exam Awake Alert, Oriented x 3, No new F.N deficits, Normal affect [place].AT,PERRAL Supple Neck,No JVD, No cervical lymphadenopathy appriciated.  Symmetrical Chest wall movement, Good air movement bilaterally, CTAB RRR,No Gallops,Rubs or new Murmurs, No Parasternal Heave +ve B.Sounds, Abd Soft, Non tender, No organomegaly appriciated, No rebound -guarding or rigidity. No Cyanosis, Clubbing or edema, No new Rash or  bruise   Data Review   CBC w Diff:  Lab Results  Component Value Date   WBC 6.9 06/23/2015   HGB 10.4* 06/23/2015   HCT 32.3* 06/23/2015   PLT 249 06/23/2015   LYMPHOPCT 22 06/22/2015   MONOPCT 5 06/22/2015   EOSPCT 0 06/22/2015   BASOPCT 0 06/22/2015    CMP:  Lab Results  Component Value Date   NA 135 06/23/2015   K 4.5 06/23/2015   CL 93* 06/23/2015   CO2 37* 06/23/2015   BUN 10 06/23/2015   CREATININE 0.40* 06/23/2015   PROT 6.6 06/22/2015   ALBUMIN 3.6 06/22/2015   BILITOT 0.5 06/22/2015   ALKPHOS 67 06/22/2015   AST 20 06/22/2015   ALT 18 06/22/2015  .   Total Time in preparing paper work, data evaluation and todays exam - 35 minutes  Leroy Sea M.D on 06/27/2015 at 1:23 PM  [facility]   Office  [phone]

## 2015-06-27 NOTE — Progress Notes (Signed)
Conducting discharge instructions with multiple issues raised by pt. Md paged

## 2015-06-27 NOTE — Progress Notes (Signed)
One time dose of morphine ordered by MD. Waiting for med to be released by pharmacy

## 2015-06-27 NOTE — Progress Notes (Signed)
Complicated discharge with several concerns voiced by pt. Pt asking about blood gas and why it has not been checked before discharge. MD paged but notified that there was no need for that at this time. Pt's friend finally came and helped calm pt down. Pt very irritable and impatient most of the time during her stay in hospital

## 2015-06-27 NOTE — Progress Notes (Signed)
1mg  morphine one time dose administered as ordered by MD. Will call MD in an hour and give update

## 2015-06-27 NOTE — Progress Notes (Signed)
Pt scheduled for discharge home this pm. Current condition stable

## 2015-06-28 ENCOUNTER — Telehealth: Payer: Self-pay | Admitting: Pulmonary Disease

## 2015-06-28 NOTE — Telephone Encounter (Signed)
Called and spoke to pt's friend, Roddie Mc. Pt is needing a HFU, pt was d/c from [facility] on 06/27/15 and was advised to f/u with pulmonary in 1 week. However, there are no available openings with any providers until 12/19 with TP. Roddie Mc is aware we will contact her back when we can offer an appt.   Dr. Isaiah Serge please advise if ok to book on 12/19 with TP or would you like to see pt sooner? If so, please advise if ok to double book. Thanks.

## 2015-06-29 ENCOUNTER — Telehealth: Payer: Self-pay | Admitting: Pulmonary Disease

## 2015-06-29 NOTE — Telephone Encounter (Signed)
Called and spoke to pt. Pt states she has had an increase in lower extremity edema and c/o oliguria x 2 days. Pt states the edema is non-piting, no discoloration and no weeping. Pt c/o slight increase in SOB since hospital d/c. Pt denies f/c/s. Pt is contributing the swelling to her spiriva, pt was taking this prior to hospitalization, did not take it during hospitalization, and started taking it again at d/c.   Dr. Isaiah Serge please advise.  (There is another phone message from 11/28 seeing when pt can come in for hospital f/u as provider schedules are full)

## 2015-06-29 NOTE — Telephone Encounter (Signed)
Spoke with Roddie Mc and informed that per PM his schedule could be double booked for pt Pt scheduled for 07/06/2015 at 1:30pm  Nothing further is needed at this time

## 2015-06-29 NOTE — Telephone Encounter (Signed)
Ok to double book

## 2015-06-30 MED ORDER — FUROSEMIDE 20 MG PO TABS: 20.0000 mg | ORAL_TABLET | Freq: Every day | ORAL | Status: DC

## 2015-06-30 NOTE — Telephone Encounter (Signed)
Still awaiting response from Dr. Isaiah Serge. Please advise thanks

## 2015-06-30 NOTE — Telephone Encounter (Signed)
Lasix 20 mg once daily. BMP in one week to check lytes

## 2015-06-30 NOTE — Telephone Encounter (Signed)
Calling back to check on status of call from yesterday.Caren Griffins

## 2015-06-30 NOTE — Telephone Encounter (Signed)
Dr. Mannam - please advise. Thanks. 

## 2015-06-30 NOTE — Telephone Encounter (Signed)
Spoke with Roddie Mc and is aware of recs. Rx called in for lasix. Pt has pending aptp 12/6. Nothing further needed

## 2015-07-06 ENCOUNTER — Ambulatory Visit: Payer: BLUE CROSS/BLUE SHIELD | Admitting: Pulmonary Disease

## 2015-07-06 ENCOUNTER — Ambulatory Visit (INDEPENDENT_AMBULATORY_CARE_PROVIDER_SITE_OTHER): Payer: BLUE CROSS/BLUE SHIELD | Admitting: Pulmonary Disease

## 2015-07-06 ENCOUNTER — Encounter: Payer: Self-pay | Admitting: Pulmonary Disease

## 2015-07-06 VITALS — BP 128/82 | HR 102 | Temp 98.7°F | Ht 68.0 in | Wt 127.0 lb

## 2015-07-06 DIAGNOSIS — J441 Chronic obstructive pulmonary disease with (acute) exacerbation: Secondary | ICD-10-CM

## 2015-07-06 MED ORDER — ROFLUMILAST 500 MCG PO TABS: 500.0000 ug | ORAL_TABLET | Freq: Every day | ORAL | Status: DC

## 2015-07-06 MED ORDER — PREDNISONE 10 MG PO TABS: 30.0000 mg | ORAL_TABLET | Freq: Every day | ORAL | Status: DC

## 2015-07-06 NOTE — Patient Instructions (Addendum)
We will start you on Daliresp You'll get an ABG and be scheduled for a sleep study to assess need for BiPAP Keep using the prednisone at 30 mg. Do not taper beyond this. Referral for hospice We will change your CODE STATUS in records to DO NOT RESUSCITATE/DO NOT INTUBATE Continue using the Symbicort, Duo nebs every 4 hours.  Return to clinic in 53month

## 2015-07-07 ENCOUNTER — Ambulatory Visit (HOSPITAL_COMMUNITY)
Admission: RE | Admit: 2015-07-07 | Discharge: 2015-07-07 | Disposition: A | Discharge status: Home / Self Care | Payer: BLUE CROSS/BLUE SHIELD | Source: Ambulatory Visit | Attending: Pulmonary Disease | Admitting: Pulmonary Disease

## 2015-07-07 ENCOUNTER — Telehealth: Payer: Self-pay | Admitting: Pulmonary Disease

## 2015-07-07 DIAGNOSIS — J441 Chronic obstructive pulmonary disease with (acute) exacerbation: Secondary | ICD-10-CM

## 2015-07-07 LAB — BLOOD GAS, ARTERIAL
ACID-BASE EXCESS: 9.5 mmol/L — AB (ref 0.0–2.0)
Bicarbonate: 34.3 mEq/L — ABNORMAL HIGH (ref 20.0–24.0)
DRAWN BY: 244901
O2 Content: 2 L/min
O2 SAT: 99.2 %
PATIENT TEMPERATURE: 98.6
TCO2: 30.9 mmol/L (ref 0–100)
pCO2 arterial: 48.7 mmHg — ABNORMAL HIGH (ref 35.0–45.0)
pH, Arterial: 7.462 — ABNORMAL HIGH (ref 7.350–7.450)
pO2, Arterial: 160 mmHg — ABNORMAL HIGH (ref 80.0–100.0)

## 2015-07-07 NOTE — Progress Notes (Signed)
Subjective:    Patient ID: Terri Stephens, female    DOB: 09/12/1955, 59 y.o.   MRN: [id number]  HPI Follow-up for management of COPD  Terri Stephens is a 59 year old with very severe COPD. GOLD Stage IV. She was seen at [facility] pulmonary in 2008 by Dr. Chase Caller. She was then lost to follow-up since she moved to [place]. She had been maintained on Advair and Spiriva. She had an admission in [place] on 10/07/14 for an acute exacerbation of COPD. She was treated with antibiotics and steroids. She also underwent left heart catheterization for elevated troponins which did not show any coronary artery disease.   Since then she has felt progressively worse with increasing dyspnea on exertion. It has reached a point where she cannot even walk across the room without having to stop. She moved to [facility] to resume pulmonary care as she felt she wasn't getting proper care at [facility].  Interim history: She was seen in the clinic on 11/3. At that time she had mild exacerbation of COPD and started having hemoptysis. She was admitted to [facility] for observation and management. She was treated with IV antibiotics and IV steroids and bronchodilators.   She was readmitted to [facility] on 11/22 with hypercarbic respiratory failure. She was briefly in the ICU on a BiPAP. Treated with IV antibiotics and steroids. Her severe back pain was evaluated with x-rays which are negative. Her rheumatoid factor was elevated. She was referred to rheumatology for a question of rheumatoid arthritis. Postdischarge she continues to have dyspnea on exertion. She is tapering her prednisone and is at 30 mg a day. She is off Spiriva as she feels that it is causing her edema. She was given Lasix 20 mg a day last week with improvement in edema. She is only taking Symbicort and DuoNeb's every 4hrs. She has significant stressors in life. She is estranged from her alcoholic husband and has high anxiety which is exacerbating her  breathing. She called 911 yesterday for increased dyspnea however she did not need to go to the emergency room. She has not smoked since her last admission   PFTs [07/17/07] FVC 1.66 [46%] FEV1 0.68 [23%] F/F 40 TLC 118% DLCO 47%  Review of Systems  Has severe dyspnea on exertion, cough productive of sputum, No hemoptysis.  Denies any fever, chills, malaise, weight loss, loss of appetite. Denies any chest pain, palpitations. Denies any nausea, vomiting, diarrhea, constipation. All other review of systems negative.    Objective:   Physical Exam  Blood pressure 128/82, pulse 102, temperature 98.7 F (37.1 C), temperature source Oral, height 5' 8"  (1.727 m), weight 127 lb (57.607 kg), SpO2 93 %.   Gen: Mild to moderate distress Neuro: No gross focal deficits. Neck: No JVD, lymphadenopathy, thyromegaly. RS: Distant breath sounds, no wheeze or crackles, nonlabored. CVS: S1-S2 heard, no murmurs rubs gallops. Abdomen: Soft, positive bowel sounds. Extremities: No edema.    Assessment & Plan:  #1 COPD Gold stage IV She has end-stage COPD with a declining functional status. She is has been hospitalized multiple times over the past month with increasing severity of exacerbations. During her last admission she had hypercarbic respiratory failure. We need to assess an ABG at baseline and determine if she needs to be on BiPAP at home. She is reducing her steroids and is at 30 mg a day right now. I have advised her to remain at that dose as she does not do well when she goes below that.  She will continue on the Symbicort and DuoNeb's. I will start her on Daliresp to reduce the frequency of exacerbations.   She met with palliative care during her last hospitalization. She asked me today if hospice would be appropriate for her. I told her that given her frequency of exacerbations and end-stage COPD she probably does not have more than a year to live. I agree with consideration of hospice. She is  tearful but acceptable of this situation. Emotional support given. She changed her CODE STATUS to DNR/DNI and agree to be referred to hospice.  #2 Active smoker She has remained tobacco free since her last admission. I have advised her to continue the same  Plan: - Referral to hospice - CODE STATUS changed to DO NOT RESUSCITATE/DO NOT INTUBATE - Continue Symbicort and DuoNeb's q4 - Continue supplemental oxygen - Keep steroids at Prednisone 30 mg a day - Check ABG and determine if she needs to be on BiPAP - Start Daliresp - Encouraged to stay off cigarettes  Marshell Garfinkel MD [facility] Pager [phone] If no answer or after 3pm call: [phone] 07/07/2015, 11:57 AM

## 2015-07-07 NOTE — Telephone Encounter (Signed)
Called and spoke with pt Pt requesting results of ABG test that was done today Informed pt that PM has not resulted reading yet and that once this is done the office will call her back to discuss results Pt voiced understanding of this and requested if possible for Dr Isaiah Serge to call her directly to review results Pt states that she has many questions to go over with provider  Dr Isaiah Serge, please advise. Thanks

## 2015-07-07 NOTE — Telephone Encounter (Signed)
Will forward this message to Shanda Bumps to look out for form

## 2015-07-08 NOTE — Telephone Encounter (Signed)
Terri Stephens has this form been received? Thanks.

## 2015-07-08 NOTE — Telephone Encounter (Signed)
Was given to PM at the visit Will discuss with him when he returns to the office tomorrow

## 2015-07-09 ENCOUNTER — Telehealth: Payer: Self-pay | Admitting: Pulmonary Disease

## 2015-07-09 MED ORDER — MORPHINE SULFATE (CONCENTRATE) 10 MG/0.5ML PO SOLN: 10.0000 mg | ORAL | Status: DC | PRN

## 2015-07-09 NOTE — Telephone Encounter (Signed)
Spoke with pt's emergency contact, aware of PM's recs.   rx up front ready for pickup.  Will be picked up on Monday.  Nothing further needed.

## 2015-07-09 NOTE — Telephone Encounter (Signed)
Waiting on rec from PM

## 2015-07-09 NOTE — Telephone Encounter (Signed)
Ok to refill one time till she is into hospice then they can take over.

## 2015-07-09 NOTE — Telephone Encounter (Signed)
Called and spoke with pt's daughter who stated that pt needs refill on morphine Informed daughter that per our records it looks like a different physician, Dr Thedore Mins, was rx'ing this med Daughter stated that Dr Thedore Mins was the hospitalitis and they were under the impression since Dr Isaiah Serge was referring pt to hospice that he was going to take over writing this rx Daughter did stated that pt was not completely out of medication at this time.  Informed daughter that i would have to send a message to the provider to get his ok on this rx Daughter voiced understanding  Dr Isaiah Serge, please advise Will also forward this to Shanda Bumps to ensure follow up on this matter

## 2015-07-12 NOTE — Telephone Encounter (Signed)
Form received from PM Faxed back to [facility] @ [phone]  LM on [name]'s VM informing her of the above Will go ahead and sign off

## 2015-07-12 NOTE — Telephone Encounter (Signed)
Terri Stephens was this taken care of?

## 2015-07-13 ENCOUNTER — Telehealth: Payer: Self-pay | Admitting: Pulmonary Disease

## 2015-07-13 NOTE — Telephone Encounter (Signed)
Hospice is calling wanting to know if Dr. Isaiah Serge will be the pt's attending MD. They also want to know if he wants their MD's to help with symptom management.  Dr. Isaiah Serge - please advise. Thanks.

## 2015-07-13 NOTE — Telephone Encounter (Signed)
Dr. Mannam - please advise. Thanks. 

## 2015-07-13 NOTE — Telephone Encounter (Signed)
I already spoke with her several days ago.

## 2015-07-14 NOTE — Telephone Encounter (Signed)
Called Amy with Hospice and informed of PM response  Nothing further is needed

## 2015-07-14 NOTE — Telephone Encounter (Signed)
Yes. That is fine.  

## 2015-07-14 NOTE — Telephone Encounter (Signed)
Amy for Hospice, cb to follow up,m [phone]

## 2015-07-14 NOTE — Telephone Encounter (Signed)
Dr. Isaiah Serge please advise as Hospice is calling back for response.

## 2015-07-16 ENCOUNTER — Telehealth: Payer: Self-pay | Admitting: Pulmonary Disease

## 2015-07-16 NOTE — Telephone Encounter (Signed)
Called spoke with Dr Delanna Notice who reported that pt declined Hospice services at this time; she requested to wait until she needs them.  Dr Delanna Notice stated that pt can be re-referred at any time.  Will sign and forward to PM as FYI. Pt is scheduled with PM for follow up on 1.20.17

## 2015-07-20 ENCOUNTER — Other Ambulatory Visit: Payer: Self-pay | Admitting: Pulmonary Disease

## 2015-08-08 ENCOUNTER — Encounter (HOSPITAL_COMMUNITY): Payer: Self-pay | Admitting: Emergency Medicine

## 2015-08-08 ENCOUNTER — Emergency Department (HOSPITAL_COMMUNITY): Payer: BLUE CROSS/BLUE SHIELD

## 2015-08-08 ENCOUNTER — Inpatient Hospital Stay (HOSPITAL_COMMUNITY)
Admission: EM | Admit: 2015-08-08 | Discharge: 2015-08-20 | DRG: 190 | Disposition: A | Discharge status: Hospice | Payer: BLUE CROSS/BLUE SHIELD | Attending: Internal Medicine | Admitting: Internal Medicine

## 2015-08-08 DIAGNOSIS — M069 Rheumatoid arthritis, unspecified: Secondary | ICD-10-CM | POA: Diagnosis present

## 2015-08-08 DIAGNOSIS — Z716 Tobacco abuse counseling: Secondary | ICD-10-CM | POA: Diagnosis not present

## 2015-08-08 DIAGNOSIS — Z801 Family history of malignant neoplasm of trachea, bronchus and lung: Secondary | ICD-10-CM

## 2015-08-08 DIAGNOSIS — J441 Chronic obstructive pulmonary disease with (acute) exacerbation: Secondary | ICD-10-CM | POA: Diagnosis present

## 2015-08-08 DIAGNOSIS — J9620 Acute and chronic respiratory failure, unspecified whether with hypoxia or hypercapnia: Secondary | ICD-10-CM | POA: Diagnosis not present

## 2015-08-08 DIAGNOSIS — Z87892 Personal history of anaphylaxis: Secondary | ICD-10-CM | POA: Diagnosis not present

## 2015-08-08 DIAGNOSIS — F17210 Nicotine dependence, cigarettes, uncomplicated: Secondary | ICD-10-CM | POA: Diagnosis present

## 2015-08-08 DIAGNOSIS — R64 Cachexia: Secondary | ICD-10-CM | POA: Diagnosis present

## 2015-08-08 DIAGNOSIS — J189 Pneumonia, unspecified organism: Secondary | ICD-10-CM | POA: Diagnosis present

## 2015-08-08 DIAGNOSIS — Z803 Family history of malignant neoplasm of breast: Secondary | ICD-10-CM | POA: Diagnosis not present

## 2015-08-08 DIAGNOSIS — I4891 Unspecified atrial fibrillation: Secondary | ICD-10-CM | POA: Diagnosis not present

## 2015-08-08 DIAGNOSIS — E039 Hypothyroidism, unspecified: Secondary | ICD-10-CM | POA: Diagnosis present

## 2015-08-08 DIAGNOSIS — R0789 Other chest pain: Secondary | ICD-10-CM | POA: Diagnosis present

## 2015-08-08 DIAGNOSIS — Z66 Do not resuscitate: Secondary | ICD-10-CM | POA: Diagnosis present

## 2015-08-08 DIAGNOSIS — R0602 Shortness of breath: Secondary | ICD-10-CM | POA: Diagnosis present

## 2015-08-08 DIAGNOSIS — Z79891 Long term (current) use of opiate analgesic: Secondary | ICD-10-CM

## 2015-08-08 DIAGNOSIS — J9622 Acute and chronic respiratory failure with hypercapnia: Secondary | ICD-10-CM | POA: Diagnosis present

## 2015-08-08 DIAGNOSIS — Z9981 Dependence on supplemental oxygen: Secondary | ICD-10-CM

## 2015-08-08 DIAGNOSIS — E43 Unspecified severe protein-calorie malnutrition: Secondary | ICD-10-CM | POA: Diagnosis present

## 2015-08-08 DIAGNOSIS — Z8249 Family history of ischemic heart disease and other diseases of the circulatory system: Secondary | ICD-10-CM

## 2015-08-08 DIAGNOSIS — F418 Other specified anxiety disorders: Secondary | ICD-10-CM | POA: Diagnosis present

## 2015-08-08 DIAGNOSIS — E038 Other specified hypothyroidism: Secondary | ICD-10-CM | POA: Diagnosis not present

## 2015-08-08 DIAGNOSIS — F17200 Nicotine dependence, unspecified, uncomplicated: Secondary | ICD-10-CM | POA: Diagnosis not present

## 2015-08-08 DIAGNOSIS — I700 Atherosclerosis of aorta: Secondary | ICD-10-CM | POA: Diagnosis present

## 2015-08-08 DIAGNOSIS — Z91030 Bee allergy status: Secondary | ICD-10-CM | POA: Diagnosis not present

## 2015-08-08 DIAGNOSIS — J9811 Atelectasis: Secondary | ICD-10-CM | POA: Diagnosis present

## 2015-08-08 DIAGNOSIS — Z885 Allergy status to narcotic agent status: Secondary | ICD-10-CM | POA: Diagnosis not present

## 2015-08-08 DIAGNOSIS — Z825 Family history of asthma and other chronic lower respiratory diseases: Secondary | ICD-10-CM

## 2015-08-08 DIAGNOSIS — R9431 Abnormal electrocardiogram [ECG] [EKG]: Secondary | ICD-10-CM | POA: Diagnosis not present

## 2015-08-08 DIAGNOSIS — J449 Chronic obstructive pulmonary disease, unspecified: Secondary | ICD-10-CM | POA: Diagnosis not present

## 2015-08-08 DIAGNOSIS — J9621 Acute and chronic respiratory failure with hypoxia: Secondary | ICD-10-CM | POA: Diagnosis present

## 2015-08-08 DIAGNOSIS — Z681 Body mass index (BMI) 19 or less, adult: Secondary | ICD-10-CM | POA: Diagnosis not present

## 2015-08-08 DIAGNOSIS — G8929 Other chronic pain: Secondary | ICD-10-CM | POA: Diagnosis present

## 2015-08-08 DIAGNOSIS — M549 Dorsalgia, unspecified: Secondary | ICD-10-CM | POA: Diagnosis present

## 2015-08-08 DIAGNOSIS — Z515 Encounter for palliative care: Secondary | ICD-10-CM | POA: Diagnosis not present

## 2015-08-08 DIAGNOSIS — J440 Chronic obstructive pulmonary disease with acute lower respiratory infection: Secondary | ICD-10-CM | POA: Diagnosis present

## 2015-08-08 HISTORY — DX: Unspecified protein-calorie malnutrition: E46

## 2015-08-08 HISTORY — DX: Chronic respiratory failure, unspecified whether with hypoxia or hypercapnia: J96.10

## 2015-08-08 HISTORY — DX: Hemoptysis: R04.2

## 2015-08-08 HISTORY — DX: Dorsalgia, unspecified: M54.9

## 2015-08-08 HISTORY — DX: Other chronic pain: G89.29

## 2015-08-08 HISTORY — DX: Anxiety disorder, unspecified: F41.9

## 2015-08-08 LAB — I-STAT CG4 LACTIC ACID, ED: LACTIC ACID, VENOUS: 1.26 mmol/L (ref 0.5–2.0)

## 2015-08-08 LAB — BASIC METABOLIC PANEL
ANION GAP: 12 (ref 5–15)
BUN: 11 mg/dL (ref 6–20)
CALCIUM: 9.1 mg/dL (ref 8.9–10.3)
CO2: 31 mmol/L (ref 22–32)
Chloride: 91 mmol/L — ABNORMAL LOW (ref 101–111)
Creatinine, Ser: 0.48 mg/dL (ref 0.44–1.00)
GFR calc Af Amer: >60 mL/min (ref >60)
Glucose, Bld: 115 mg/dL — ABNORMAL HIGH (ref 65–99)
POTASSIUM: 3.7 mmol/L (ref 3.5–5.1)
SODIUM: 134 mmol/L — AB (ref 135–145)

## 2015-08-08 LAB — CBC WITH DIFFERENTIAL/PLATELET
BASOS ABS: 0 10*3/uL (ref 0.0–0.1)
BASOS PCT: 0 %
EOS ABS: 0.1 10*3/uL (ref 0.0–0.7)
EOS PCT: 0 %
HCT: 33.7 % — ABNORMAL LOW (ref 36.0–46.0)
Hemoglobin: 11.2 g/dL — ABNORMAL LOW (ref 12.0–15.0)
LYMPHS PCT: 4 %
Lymphs Abs: 0.6 10*3/uL — ABNORMAL LOW (ref 0.7–4.0)
MCH: 30.9 pg (ref 26.0–34.0)
MCHC: 33.2 g/dL (ref 30.0–36.0)
MCV: 92.8 fL (ref 78.0–100.0)
Monocytes Absolute: 0.2 10*3/uL (ref 0.1–1.0)
Monocytes Relative: 1 %
Neutro Abs: 12.2 10*3/uL — ABNORMAL HIGH (ref 1.7–7.7)
Neutrophils Relative %: 95 %
PLATELETS: 323 10*3/uL (ref 150–400)
RBC: 3.63 MIL/uL — AB (ref 3.87–5.11)
RDW: 12.8 % (ref 11.5–15.5)
WBC: 13 10*3/uL — AB (ref 4.0–10.5)

## 2015-08-08 LAB — TSH: TSH: 0.264 u[IU]/mL — ABNORMAL LOW (ref 0.350–4.500)

## 2015-08-08 LAB — TROPONIN I

## 2015-08-08 MED ORDER — ASPIRIN EC 81 MG PO TBEC
81.0000 mg | DELAYED_RELEASE_TABLET | Freq: Every day | ORAL | Status: DC
  Administered 2015-08-09 – 2015-08-13 (×5): 81 mg via ORAL
  Filled 2015-08-08 (×5): qty 1

## 2015-08-08 MED ORDER — SODIUM CHLORIDE 0.9 % IJ SOLN: 3.0000 mL | INTRAMUSCULAR | Status: DC | PRN

## 2015-08-08 MED ORDER — ALBUTEROL SULFATE (2.5 MG/3ML) 0.083% IN NEBU
2.5000 mg | INHALATION_SOLUTION | RESPIRATORY_TRACT | Status: DC | PRN
  Administered 2015-08-09 (×2): 2.5 mg via RESPIRATORY_TRACT
  Filled 2015-08-08 (×2): qty 3

## 2015-08-08 MED ORDER — BISACODYL 5 MG PO TBEC
5.0000 mg | DELAYED_RELEASE_TABLET | Freq: Every day | ORAL | Status: DC | PRN
  Administered 2015-08-09: 5 mg via ORAL
  Filled 2015-08-08: qty 1

## 2015-08-08 MED ORDER — IBUPROFEN 200 MG PO TABS
800.0000 mg | ORAL_TABLET | Freq: Once | ORAL | Status: AC
  Administered 2015-08-08: 800 mg via ORAL
  Filled 2015-08-08: qty 4

## 2015-08-08 MED ORDER — POLYETHYLENE GLYCOL 3350 17 G PO PACK: 17.0000 g | PACK | Freq: Every day | ORAL | Status: DC | PRN

## 2015-08-08 MED ORDER — SODIUM CHLORIDE 0.9 % IV SOLN: 250.0000 mL | INTRAVENOUS | Status: DC | PRN

## 2015-08-08 MED ORDER — METHYLPREDNISOLONE SODIUM SUCC 125 MG IJ SOLR
60.0000 mg | Freq: Four times a day (QID) | INTRAMUSCULAR | Status: DC
  Administered 2015-08-08 – 2015-08-12 (×16): 60 mg via INTRAVENOUS
  Filled 2015-08-08 (×16): qty 2

## 2015-08-08 MED ORDER — ROFLUMILAST 500 MCG PO TABS
500.0000 ug | ORAL_TABLET | Freq: Every day | ORAL | Status: DC
  Administered 2015-08-09 – 2015-08-13 (×5): 500 ug via ORAL
  Filled 2015-08-08 (×6): qty 1

## 2015-08-08 MED ORDER — GUAIFENESIN-DM 100-10 MG/5ML PO SYRP: 5.0000 mL | ORAL_SOLUTION | ORAL | Status: DC | PRN

## 2015-08-08 MED ORDER — ACETAMINOPHEN 325 MG PO TABS: 650.0000 mg | ORAL_TABLET | Freq: Four times a day (QID) | ORAL | Status: DC | PRN

## 2015-08-08 MED ORDER — LORAZEPAM 2 MG/ML IJ SOLN
0.5000 mg | Freq: Once | INTRAMUSCULAR | Status: AC
  Administered 2015-08-09: 0.5 mg via INTRAVENOUS
  Filled 2015-08-08: qty 1

## 2015-08-08 MED ORDER — METHYLPREDNISOLONE SODIUM SUCC 125 MG IJ SOLR
125.0000 mg | Freq: Once | INTRAMUSCULAR | Status: DC
  Filled 2015-08-08: qty 2

## 2015-08-08 MED ORDER — GUAIFENESIN ER 600 MG PO TB12
1200.0000 mg | ORAL_TABLET | Freq: Two times a day (BID) | ORAL | Status: DC | PRN
  Administered 2015-08-08 – 2015-08-10 (×4): 1200 mg via ORAL
  Filled 2015-08-08 (×5): qty 2

## 2015-08-08 MED ORDER — LEVOTHYROXINE SODIUM 50 MCG PO TABS
50.0000 ug | ORAL_TABLET | Freq: Every day | ORAL | Status: DC
  Administered 2015-08-09 – 2015-08-13 (×5): 50 ug via ORAL
  Filled 2015-08-08 (×5): qty 1

## 2015-08-08 MED ORDER — NICOTINE 14 MG/24HR TD PT24
14.0000 mg | MEDICATED_PATCH | Freq: Every day | TRANSDERMAL | Status: DC
  Administered 2015-08-08 – 2015-08-20 (×12): 14 mg via TRANSDERMAL
  Filled 2015-08-08 (×13): qty 1

## 2015-08-08 MED ORDER — LEVOFLOXACIN IN D5W 750 MG/150ML IV SOLN
750.0000 mg | Freq: Once | INTRAVENOUS | Status: AC
  Administered 2015-08-08: 750 mg via INTRAVENOUS
  Filled 2015-08-08: qty 150

## 2015-08-08 MED ORDER — IPRATROPIUM-ALBUTEROL 0.5-2.5 (3) MG/3ML IN SOLN
3.0000 mL | Freq: Once | RESPIRATORY_TRACT | Status: AC
  Administered 2015-08-08: 3 mL via RESPIRATORY_TRACT

## 2015-08-08 MED ORDER — LEVOFLOXACIN IN D5W 750 MG/150ML IV SOLN
750.0000 mg | INTRAVENOUS | Status: DC
  Administered 2015-08-09: 750 mg via INTRAVENOUS
  Filled 2015-08-08 (×2): qty 150

## 2015-08-08 MED ORDER — ALBUTEROL SULFATE (2.5 MG/3ML) 0.083% IN NEBU
2.5000 mg | INHALATION_SOLUTION | RESPIRATORY_TRACT | Status: DC
  Administered 2015-08-08 – 2015-08-09 (×4): 2.5 mg via RESPIRATORY_TRACT
  Filled 2015-08-08 (×4): qty 3

## 2015-08-08 MED ORDER — ACETAMINOPHEN 650 MG RE SUPP: 650.0000 mg | Freq: Four times a day (QID) | RECTAL | Status: DC | PRN

## 2015-08-08 MED ORDER — MORPHINE SULFATE (CONCENTRATE) 10 MG/0.5ML PO SOLN
10.0000 mg | ORAL | Status: DC | PRN
  Administered 2015-08-08 – 2015-08-10 (×11): 10 mg via ORAL
  Filled 2015-08-08 (×12): qty 0.5

## 2015-08-08 MED ORDER — ENOXAPARIN SODIUM 30 MG/0.3ML ~~LOC~~ SOLN
30.0000 mg | SUBCUTANEOUS | Status: DC
  Administered 2015-08-08: 30 mg via SUBCUTANEOUS
  Filled 2015-08-08: qty 0.3

## 2015-08-08 MED ORDER — FENTANYL CITRATE (PF) 100 MCG/2ML IJ SOLN
100.0000 ug | Freq: Once | INTRAMUSCULAR | Status: AC
  Administered 2015-08-08: 100 ug via INTRAVENOUS
  Filled 2015-08-08: qty 2

## 2015-08-08 MED ORDER — SODIUM CHLORIDE 0.9 % IJ SOLN
3.0000 mL | Freq: Two times a day (BID) | INTRAMUSCULAR | Status: DC
  Administered 2015-08-08 – 2015-08-20 (×15): 3 mL via INTRAVENOUS

## 2015-08-08 MED ORDER — FUROSEMIDE 20 MG PO TABS
20.0000 mg | ORAL_TABLET | Freq: Every day | ORAL | Status: DC
  Administered 2015-08-09 – 2015-08-13 (×4): 20 mg via ORAL
  Filled 2015-08-08 (×4): qty 1

## 2015-08-08 MED ORDER — SERTRALINE HCL 100 MG PO TABS
100.0000 mg | ORAL_TABLET | Freq: Every day | ORAL | Status: DC
  Administered 2015-08-09 – 2015-08-12 (×4): 100 mg via ORAL
  Filled 2015-08-08 (×4): qty 1

## 2015-08-08 MED ORDER — ALPRAZOLAM 0.25 MG PO TABS
0.2500 mg | ORAL_TABLET | Freq: Three times a day (TID) | ORAL | Status: DC | PRN
  Administered 2015-08-08 – 2015-08-09 (×2): 0.25 mg via ORAL
  Filled 2015-08-08 (×2): qty 1

## 2015-08-08 MED ORDER — MORPHINE SULFATE (PF) 4 MG/ML IV SOLN
4.0000 mg | Freq: Once | INTRAVENOUS | Status: AC
  Administered 2015-08-08: 4 mg via INTRAVENOUS
  Filled 2015-08-08: qty 1

## 2015-08-08 NOTE — Progress Notes (Signed)
Per pt's request notified md about home medication mucenex and symbicort.  Will continue to monitor. Karena Addison T

## 2015-08-08 NOTE — ED Notes (Signed)
Pt remains on bi-pap  Family at the bedside

## 2015-08-08 NOTE — Progress Notes (Signed)
Md called per pt's request  Takes ibuprofen with morphine for back pain.  Will continue to monitor. Karena Addison T

## 2015-08-08 NOTE — ED Notes (Signed)
Pt's response to "blood transfusion " question -- "why would I want blood, I am a DNR!!"

## 2015-08-08 NOTE — ED Notes (Signed)
Dr. Donnald Garre at bedside with the patient.

## 2015-08-08 NOTE — ED Notes (Signed)
Report callerd to rn on 2h

## 2015-08-08 NOTE — ED Notes (Signed)
To ED via [facility] from home with c/o shortness of breath= on home O2 at 2l/m/[place], On C-Pap on arrival- IV 20 g Left FA -- received 10mg  Albuterol /0.5mg  atrovent, Solumedrol 125mg  IV, Mg- 2 Gm -- states has taken prednisone 30mg  po at home, cough productive for "thick yellow shit" thick sputum----

## 2015-08-08 NOTE — Progress Notes (Signed)
ANTIBIOTIC CONSULT NOTE - INITIAL  Pharmacy Consult for levofloxacin Indication: rule out pneumonia  Allergies  Allergen Reactions  . Bee Venom Anaphylaxis  . Oxycodone Hcl Shortness Of Breath, Itching and Swelling    REACTION: All "codones" in any form    Patient Measurements: Height: 5\' 6"  (167.6 cm) Weight: 120 lb (54.432 kg) IBW/kg (Calculated) : 59.3  Vital Signs: BP: 119/76 mmHg (01/08 1930) Pulse Rate: 76 (01/08 1930) Intake/Output from previous day:   Intake/Output from this shift:    Labs:  Recent Labs  08/08/15 1412  WBC 13.0*  HGB 11.2*  PLT 323  CREATININE 0.48   Estimated Creatinine Clearance: 66.6 mL/min (by C-G formula based on Cr of 0.48). No results for input(s): VANCOTROUGH, VANCOPEAK, VANCORANDOM, GENTTROUGH, GENTPEAK, GENTRANDOM, TOBRATROUGH, TOBRAPEAK, TOBRARND, AMIKACINPEAK, AMIKACINTROU, AMIKACIN in the last 72 hours.   Assessment: 60 yo female presenting with SOB and increased sputum  PMH: COPD on home O2, Depression  ID: abx for COPD exacerbation/PNA. WBC 13, AF  Levofloxacin 1/8>>  1/8 Blood x 2  Renal: SCr 0.48  Plan:  Levofloxacin 750 mg IV q24h Monitor clinical status, culture data F/U LOT  3/8, PharmD, BCPS, [facility] Clinical Pharmacist Pager [phone] 08/08/2015 7:57 PM

## 2015-08-08 NOTE — H&P (Signed)
[facility] History and Physical  Tanja Gift EPP:[id number] DOB: May 16, 1956 DOA: 08/08/2015  Referring physician: Donnald Garre PCP: Chilton Greathouse, MD   Chief Complaint: worsening sob and increased sputum  HPI: Terri Stephens is a 60 y.o. female with a past medical history that includes COPD Gold stage IV on 2 L nasal cannula at home, depression, ongoing tobacco use presents to the emergency department with a 3-4 day history of progressive shortness of breath and dyspnea on exertion increased sputum production and coughing. Evaluation reveals acute on chronic respiratory failure likely related to COPD exacerbation and possible pneumonia.  inFormation is obtained from the patient and the daughter who is at the bedside. Past 3- 4 days patient developed persistent worsening shortness of breath dyspnea on exertion and coughing. She used her home nebulizer and inhalers with no improvement. She denies fever chills headache syncope or near-syncope. She denies chest pain palpitations abdominal pain nausea vomiting diarrhea. She denies dysuria hematuria frequency or urgency. She denies lower extremity edema or orthopnea.  Emergency department she is afebrile hemodynamically stable requires BiPAP. He saved steroids and nebulizers. Condition improved on admission  Review of Systems:  10 point review of systems complete and all systems are negative except as indicated in the history of present illness Past Medical History  Diagnosis Date  . COPD (chronic obstructive pulmonary disease) (HCC)     dx'ed at admission for exacerbation 07/2007.  normal Alpha 1 Antitrypsin level 07/2007  . Asthma     labeled as asthma prior to Dec 2008.  clinically she has COPD  . Hypothyroidism   . Hyponatremia     developed 07/2007 during hospitalization.  resolved  . Tobacco abuse    Past Surgical History  Procedure Laterality Date  . Cardiac catheterization  March 2016   Social History:  reports that she has  been smoking Cigarettes.  She has a 20 pack-year smoking history. She does not have any smokeless tobacco history on file. She reports that she drinks about 1.2 oz of alcohol per week. She reports that she does not use illicit drugs.  Allergies  Allergen Reactions  . Bee Venom Anaphylaxis  . Oxycodone Hcl Shortness Of Breath, Itching and Swelling    REACTION: All "codones" in any form    Family History  Problem Relation Age of Onset  . Emphysema Mother   . Cancer - Lung Father   . Heart disease Father   . Heart disease Paternal Grandmother   . Clotting disorder Father   . Cancer Maternal Grandmother     breast     Prior to Admission medications   Medication Sig Start Date End Date Taking? Authorizing Provider  albuterol (PROAIR HFA) 108 (90 BASE) MCG/ACT inhaler Inhale 1-2 puffs into the lungs every 6 (six) hours as needed for wheezing.    Yes Historical Provider, MD  albuterol (PROVENTIL HFA;VENTOLIN HFA) 108 (90 Base) MCG/ACT inhaler Inhale 1 puff into the lungs every 6 (six) hours as needed for wheezing or shortness of breath.   Yes Historical Provider, MD  albuterol (PROVENTIL) (2.5 MG/3ML) 0.083% nebulizer solution Take 3 mLs (2.5 mg total) by nebulization every 6 (six) hours as needed for wheezing or shortness of breath. 06/11/15  Yes Coralyn Helling, MD  ALPRAZolam (XANAX) 0.25 MG tablet Take 1 tablet (0.25 mg total) by mouth 3 (three) times daily as needed for anxiety. 06/06/15  Yes Bernadene Person, NP  aspirin EC 81 MG tablet Take 81 mg by mouth daily.   Yes Historical  Provider, MD  budesonide-formoterol (SYMBICORT) 160-4.5 MCG/ACT inhaler Inhale 2 puffs into the lungs 2 (two) times daily. 06/06/15  Yes Bernadene Person, NP  furosemide (LASIX) 20 MG tablet TAKE 1 TABLET BY MOUTH EVERY DAY 07/21/15  Yes Praveen Mannam, MD  guaiFENesin (MUCINEX) 600 MG 12 hr tablet Take 1,200 mg by mouth 2 (two) times daily.   Yes Historical Provider, MD  ibuprofen (ADVIL,MOTRIN) 800 MG tablet  Take 800 mg by mouth every 8 (eight) hours as needed.   Yes Historical Provider, MD  ipratropium-albuterol (DUONEB) 0.5-2.5 (3) MG/3ML SOLN Take 3 mLs by nebulization every 4 (four) hours. 06/27/15  Yes Leroy Sea, MD  levothyroxine (SYNTHROID, LEVOTHROID) 50 MCG tablet Take 50 mcg by mouth daily.   Yes Historical Provider, MD  Morphine Sulfate (MORPHINE CONCENTRATE) 10 MG/0.5ML SOLN concentrated solution Take 0.5 mLs (10 mg total) by mouth every 3 (three) hours as needed for moderate pain or severe pain. 07/09/15  Yes Praveen Mannam, MD  Multiple Vitamins-Minerals (MULTIVITAMIN & MINERAL PO) Take 1 tablet by mouth daily.   Yes Historical Provider, MD  predniSONE (DELTASONE) 10 MG tablet Take 3 tablets (30 mg total) by mouth daily with breakfast. 07/06/15  Yes Praveen Mannam, MD  roflumilast (DALIRESP) 500 MCG TABS tablet Take 1 tablet (500 mcg total) by mouth daily. 07/06/15  Yes Praveen Mannam, MD  sertraline (ZOLOFT) 100 MG tablet Take 100 mg by mouth daily.   Yes Historical Provider, MD  tiotropium (SPIRIVA) 18 MCG inhalation capsule Place 18 mcg into inhaler and inhale daily.   Yes Historical Provider, MD  nicotine (NICODERM CQ - DOSED IN MG/24 HOURS) 14 mg/24hr patch Place 1 patch (14 mg total) onto the skin daily. Patient taking differently: Place 14 mg onto the skin daily as needed.  06/27/15   Leroy Sea, MD  predniSONE (DELTASONE) 5 MG tablet Label  & dispense according to the schedule below. 10 Pills PO for 3 days then, 8 Pills PO for 3 days, 6 Pills PO for 3 days, 4 Pills PO for 3 days, then continue taking 10 mg prednisone daily as before to you see your rheumatologist and lung doctor. Patient not taking: Reported on 08/08/2015 06/27/15   Leroy Sea, MD  valACYclovir (VALTREX) 1000 MG tablet Take 1,000 mg by mouth 3 (three) times daily as needed (outbreaks).     Historical Provider, MD   Physical Exam: Filed Vitals:   08/08/15 1400 08/08/15 1415 08/08/15 1430 08/08/15 1445    BP: 124/72 98/73 120/78 111/73  Pulse: 96 96 99 90  Resp: 22 23 14 14   Height:      Weight:      SpO2: 100% 99% 100% 100%    Wt Readings from Last 3 Encounters:  08/08/15 54.432 kg (120 lb)  07/06/15 57.607 kg (127 lb)  06/27/15 56.8 kg (125 lb 3.5 oz)    General:  Appears slightly anxious but comfortable, chronically ill Eyes: PERRL, normal lids, irises & conjunctiva ENT: grossly normal hearing, lips & tongue Neck: no LAD, masses or thyromegaly Cardiovascular: RRR, no m/r/g. No LE edema.  Respiratory: Breath sounds quite diminished throughout normal respiratory effort on BiPAP Abdomen: soft, ntnd positive throughout Skin: no rash or induration seen on limited exam Musculoskeletal: grossly normal tone BUE/BLE Psychiatric: grossly normal mood and affect, speech fluent and appropriate Neurologic: grossly non-focal. speech clear facial symmetry           Labs on Admission:  Basic Metabolic Panel:  Recent Labs Lab  08/08/15 1412  NA 134*  K 3.7  CL 91*  CO2 31  GLUCOSE 115*  BUN 11  CREATININE 0.48  CALCIUM 9.1   Liver Function Tests: No results for input(s): AST, ALT, ALKPHOS, BILITOT, PROT, ALBUMIN in the last 168 hours. No results for input(s): LIPASE, AMYLASE in the last 168 hours. No results for input(s): AMMONIA in the last 168 hours. CBC:  Recent Labs Lab 08/08/15 1412  WBC 13.0*  NEUTROABS 12.2*  HGB 11.2*  HCT 33.7*  MCV 92.8  PLT 323   Cardiac Enzymes:  Recent Labs Lab 08/08/15 1412  TROPONINI <0.03    BNP (last 3 results)  Recent Labs  06/03/15 1956 06/22/15 0553  BNP 57.1 94.8    ProBNP (last 3 results) No results for input(s): PROBNP in the last 8760 hours.  CBG: No results for input(s): GLUCAP in the last 168 hours.  Radiological Exams on Admission: Dg Chest Port 1 View  08/08/2015  CLINICAL DATA:  Patient with cough and COPD. EXAM: PORTABLE CHEST 1 VIEW COMPARISON:  Chest radiograph 06/22/2015. FINDINGS: Stable cardiac  and mediastinal contours. Minimal heterogeneous opacities bilateral lung bases. No large area of pulmonary consolidation. No pleural effusion or pneumothorax. IMPRESSION: Basilar heterogeneous opacities favored represent atelectasis. Electronically Signed   By: Annia Belt M.D.   On: 08/08/2015 14:38    EKG: Independently reviewed.  Assessment/Plan Principal Problem:   Acute on chronic respiratory failure (HCC) Active Problems:   Hypothyroidism   TOBACCO ABUSE   Coronary atherosclerosis   COPD exacerbation (HCC)   CAP (community acquired pneumonia)  1. Acute on chronic respiratory failure multifactorial related to COPD exacerbation and possible early development pneumonia. mild leukocytosis increased sputum production. Increased oxygen demand -Admit to step down a continue BiPAP wean as able. On home oxygen 2 L -Steroids -Nebulizers scheduled -Flutter valve -Antibiotics per protocol -Sputum culture as able -Blood culture   #2. COPD. Home oxygen at 2 L. Low-dose prednisone daily. Chart review indicates rapid worsening and last hospitalization include palliative care. Continues to smoke -see #1  #3. Tobacco use cessation counseling offered  #4 hypothyroidism.  -Obtain TSH  5. Chronic pain. Appears stable at baseline -Continue home regimen  6. Anxiety/depression. Stable at baseline -continue xanax and zoloft  Code Status: dnr DVT Prophylaxis: Family Communication: daughter at bedside Disposition Plan: home in 2-3 days  Time spent: 60 minutes  [facility] M [facility]

## 2015-08-08 NOTE — ED Provider Notes (Signed)
CSN: [id number]     Arrival date & time 08/08/15  1314 History   First MD Initiated Contact with Patient 08/08/15 1319     Chief Complaint  Patient presents with  . Respiratory Distress     (Consider location/radiation/quality/duration/timing/severity/associated sxs/prior Treatment) HPI Patient has chronic, advanced baseline COPD. She is on 2 L nasal cannula. Patient is on oral morphine for management of chronic pain. She reports over the past 2 days her cough has changed to have significant increased sputum production. She also reports central burning chest pain with cough which is different for her. No documented fever. Shortness of breath increased significantly over the past day. No lower extremity swelling or calf pain. Upon EMS arrival patient was already dyspnea and BiPAP applied. Patient reports significant improvement since being placed on BiPAP. Past Medical History  Diagnosis Date  . COPD (chronic obstructive pulmonary disease) (HCC)     dx'ed at admission for exacerbation 07/2007.  normal Alpha 1 Antitrypsin level 07/2007  . Asthma     labeled as asthma prior to Dec 2008.  clinically she has COPD  . Hypothyroidism   . Hyponatremia     developed 07/2007 during hospitalization.  resolved  . Tobacco abuse   . Chronic respiratory failure (HCC)   . Anxiety   . Protein calorie malnutrition (HCC)   . Hemoptysis     06/2015 in setting of COPD exacerbation  . Chronic back pain    Past Surgical History  Procedure Laterality Date  . Cardiac catheterization  March 2016   Family History  Problem Relation Age of Onset  . Emphysema Mother   . Cancer - Lung Father   . Heart disease Father   . Heart disease Paternal Grandmother   . Clotting disorder Father   . Cancer Maternal Grandmother     breast   Social History  Substance Use Topics  . Smoking status: Current Every Day Smoker -- 0.50 packs/day for 40 years    Types: Cigarettes  . Smokeless tobacco: Current User  .  Alcohol Use: 1.2 oz/week    2 Cans of beer, 0 Standard drinks or equivalent per week     Comment: rare   OB History    No data available     Review of Systems 10 Systems reviewed and are negative for acute change except as noted in the HPI.    Allergies  Bee venom and Oxycodone hcl  Home Medications   Prior to Admission medications   Medication Sig Start Date End Date Taking? Authorizing Provider  albuterol (PROAIR HFA) 108 (90 BASE) MCG/ACT inhaler Inhale 1-2 puffs into the lungs every 6 (six) hours as needed for wheezing.    Yes Historical Provider, MD  albuterol (PROVENTIL HFA;VENTOLIN HFA) 108 (90 Base) MCG/ACT inhaler Inhale 1 puff into the lungs every 6 (six) hours as needed for wheezing or shortness of breath.   Yes Historical Provider, MD  albuterol (PROVENTIL) (2.5 MG/3ML) 0.083% nebulizer solution Take 3 mLs (2.5 mg total) by nebulization every 6 (six) hours as needed for wheezing or shortness of breath. 06/11/15  Yes Coralyn Helling, MD  ALPRAZolam (XANAX) 0.25 MG tablet Take 1 tablet (0.25 mg total) by mouth 3 (three) times daily as needed for anxiety. 06/06/15  Yes Bernadene Person, NP  aspirin EC 81 MG tablet Take 81 mg by mouth daily.   Yes Historical Provider, MD  budesonide-formoterol (SYMBICORT) 160-4.5 MCG/ACT inhaler Inhale 2 puffs into the lungs 2 (two) times daily. 06/06/15  Yes Bernadene Person, NP  furosemide (LASIX) 20 MG tablet TAKE 1 TABLET BY MOUTH EVERY DAY 07/21/15  Yes Praveen Mannam, MD  guaiFENesin (MUCINEX) 600 MG 12 hr tablet Take 1,200 mg by mouth 2 (two) times daily.   Yes Historical Provider, MD  ibuprofen (ADVIL,MOTRIN) 800 MG tablet Take 800 mg by mouth every 8 (eight) hours as needed.   Yes Historical Provider, MD  ipratropium-albuterol (DUONEB) 0.5-2.5 (3) MG/3ML SOLN Take 3 mLs by nebulization every 4 (four) hours. 06/27/15  Yes Leroy Sea, MD  levothyroxine (SYNTHROID, LEVOTHROID) 50 MCG tablet Take 50 mcg by mouth daily.   Yes  Historical Provider, MD  Morphine Sulfate (MORPHINE CONCENTRATE) 10 MG/0.5ML SOLN concentrated solution Take 0.5 mLs (10 mg total) by mouth every 3 (three) hours as needed for moderate pain or severe pain. 07/09/15  Yes Praveen Mannam, MD  Multiple Vitamins-Minerals (MULTIVITAMIN & MINERAL PO) Take 1 tablet by mouth daily.   Yes Historical Provider, MD  predniSONE (DELTASONE) 10 MG tablet Take 3 tablets (30 mg total) by mouth daily with breakfast. 07/06/15  Yes Praveen Mannam, MD  roflumilast (DALIRESP) 500 MCG TABS tablet Take 1 tablet (500 mcg total) by mouth daily. 07/06/15  Yes Praveen Mannam, MD  sertraline (ZOLOFT) 100 MG tablet Take 100 mg by mouth daily.   Yes Historical Provider, MD  tiotropium (SPIRIVA) 18 MCG inhalation capsule Place 18 mcg into inhaler and inhale daily.   Yes Historical Provider, MD  nicotine (NICODERM CQ - DOSED IN MG/24 HOURS) 14 mg/24hr patch Place 1 patch (14 mg total) onto the skin daily. Patient taking differently: Place 14 mg onto the skin daily as needed.  06/27/15   Leroy Sea, MD  predniSONE (DELTASONE) 5 MG tablet Label  & dispense according to the schedule below. 10 Pills PO for 3 days then, 8 Pills PO for 3 days, 6 Pills PO for 3 days, 4 Pills PO for 3 days, then continue taking 10 mg prednisone daily as before to you see your rheumatologist and lung doctor. Patient not taking: Reported on 08/08/2015 06/27/15   Leroy Sea, MD  valACYclovir (VALTREX) 1000 MG tablet Take 1,000 mg by mouth 3 (three) times daily as needed (outbreaks).     Historical Provider, MD   BP 144/125 mmHg  Pulse 77  Temp(Src) 98.6 F (37 C) (Oral)  Resp 18  Ht 5\' 8"  (1.727 m)  Wt 126 lb 8 oz (57.38 kg)  BMI 19.24 kg/m2  SpO2 99% Physical Exam  Constitutional: She is oriented to person, place, and time.  Patient is alert and nontoxic. She is on BiPAP but can communicate well and is understandable. Only mild increased work of breathing on BiPAP machine.  HENT:  Head:  Normocephalic and atraumatic.  Eyes: EOM are normal. Pupils are equal, round, and reactive to light.  Neck: Neck supple.  Cardiovascular: Regular rhythm, normal heart sounds and intact distal pulses.   Mild tachycardia.  Pulmonary/Chest:  Patient is on BiPAP for first examination. Mild increased work of breathing. Poor air flow to the bases. Fine expiratory wheeze.  Abdominal: Soft. Bowel sounds are normal. She exhibits no distension. There is no tenderness.  Musculoskeletal: Normal range of motion. She exhibits no edema or tenderness.  Neurological: She is alert and oriented to person, place, and time. She has normal strength. Coordination normal. GCS eye subscore is 4. GCS verbal subscore is 5. GCS motor subscore is 6.  Skin: Skin is warm, dry and intact.  Psychiatric:  She has a normal mood and affect.    ED patient presents with history of advanced COPD. Course  Procedures (including critical care time)  CRITICAL CARE Performed by: Arby Barrette   Total critical care time: 30 minutes  Critical care time was exclusive of separately billable procedures and treating other patients.  Critical care was necessary to trea Consult:t or prevent imminent or life-threatening deterioration.  Critical care was time spent personally by me on the following activities: development of treatment plan with patient and/or surrogate as well as nursing, discussions with consultants, evaluation of patient's response to treatment, examination of patient, obtaining history from patient or surrogate, ordering and performing treatments and interventions, ordering and review of laboratory studies, ordering and review of radiographic studies, pulse oximetry and re-evaluation of patient's condition. Labs Review Labs Reviewed  BASIC METABOLIC PANEL - Abnormal; Notable for the following:    Sodium 134 (*)    Chloride 91 (*)    Glucose, Bld 115 (*)    All other components within normal limits  CBC WITH  DIFFERENTIAL/PLATELET - Abnormal; Notable for the following:    WBC 13.0 (*)    RBC 3.63 (*)    Hemoglobin 11.2 (*)    HCT 33.7 (*)    Neutro Abs 12.2 (*)    Lymphs Abs 0.6 (*)    All other components within normal limits  TSH - Abnormal; Notable for the following:    TSH 0.264 (*)    All other components within normal limits  BASIC METABOLIC PANEL - Abnormal; Notable for the following:    Sodium 133 (*)    Chloride 88 (*)    CO2 34 (*)    Glucose, Bld 121 (*)    All other components within normal limits  CBC - Abnormal; Notable for the following:    RBC 3.21 (*)    Hemoglobin 9.8 (*)    HCT 29.5 (*)    All other components within normal limits  BASIC METABOLIC PANEL - Abnormal; Notable for the following:    Sodium 133 (*)    Chloride 90 (*)    CO2 35 (*)    Glucose, Bld 135 (*)    BUN 21 (*)    All other components within normal limits  CBC - Abnormal; Notable for the following:    RBC 3.23 (*)    Hemoglobin 10.0 (*)    HCT 30.1 (*)    All other components within normal limits  CULTURE, BLOOD (ROUTINE X 2)  CULTURE, BLOOD (ROUTINE X 2)  MRSA PCR SCREENING  TROPONIN I  PROCALCITONIN  TROPONIN I  I-STAT CG4 LACTIC ACID, ED    Imaging Review Dg Chest Port 1 View  08/08/2015  CLINICAL DATA:  Patient with cough and COPD. EXAM: PORTABLE CHEST 1 VIEW COMPARISON:  Chest radiograph 06/22/2015. FINDINGS: Stable cardiac and mediastinal contours. Minimal heterogeneous opacities bilateral lung bases. No large area of pulmonary consolidation. No pleural effusion or pneumothorax. IMPRESSION: Basilar heterogeneous opacities favored represent atelectasis. Electronically Signed   By: Annia Belt M.D.   On: 08/08/2015 14:38   I have personally reviewed and evaluated these images and lab results as part of my medical decision-making.   EKG Interpretation None     Consult:[facility] MDM   Final diagnoses:  Acute on chronic respiratory failure, unspecified whether with  hypoxia or hypercapnia (HCC)  End stage COPD (HCC)  Chronic pain   Patient presents with h/o advanced COPD. Worsening symptoms c/w acute on chronic respiratory  failure due to COPD. Patient improving with Bipap and nebulizer treatment, Solumedrol given by EMS en route. Patient will be admitted to [facility] for ongoing treatment.    Arby Barrette, MD 08/10/15 [phone]

## 2015-08-09 ENCOUNTER — Encounter (HOSPITAL_COMMUNITY): Payer: Self-pay | Admitting: Physician Assistant

## 2015-08-09 ENCOUNTER — Ambulatory Visit: Payer: BLUE CROSS/BLUE SHIELD | Admitting: Pulmonary Disease

## 2015-08-09 DIAGNOSIS — F418 Other specified anxiety disorders: Secondary | ICD-10-CM | POA: Diagnosis present

## 2015-08-09 DIAGNOSIS — E038 Other specified hypothyroidism: Secondary | ICD-10-CM

## 2015-08-09 DIAGNOSIS — R9431 Abnormal electrocardiogram [ECG] [EKG]: Secondary | ICD-10-CM | POA: Diagnosis present

## 2015-08-09 DIAGNOSIS — R0789 Other chest pain: Secondary | ICD-10-CM | POA: Diagnosis present

## 2015-08-09 LAB — CBC
HEMATOCRIT: 29.5 % — AB (ref 36.0–46.0)
HEMOGLOBIN: 9.8 g/dL — AB (ref 12.0–15.0)
MCH: 30.5 pg (ref 26.0–34.0)
MCHC: 33.2 g/dL (ref 30.0–36.0)
MCV: 91.9 fL (ref 78.0–100.0)
Platelets: 305 10*3/uL (ref 150–400)
RBC: 3.21 MIL/uL — AB (ref 3.87–5.11)
RDW: 12.7 % (ref 11.5–15.5)
WBC: 7.1 10*3/uL (ref 4.0–10.5)

## 2015-08-09 LAB — BASIC METABOLIC PANEL
ANION GAP: 11 (ref 5–15)
BUN: 12 mg/dL (ref 6–20)
CHLORIDE: 88 mmol/L — AB (ref 101–111)
CO2: 34 mmol/L — ABNORMAL HIGH (ref 22–32)
Calcium: 9 mg/dL (ref 8.9–10.3)
Creatinine, Ser: 0.47 mg/dL (ref 0.44–1.00)
GFR calc non Af Amer: >60 mL/min (ref >60)
Glucose, Bld: 121 mg/dL — ABNORMAL HIGH (ref 65–99)
POTASSIUM: 3.9 mmol/L (ref 3.5–5.1)
SODIUM: 133 mmol/L — AB (ref 135–145)

## 2015-08-09 LAB — TROPONIN I

## 2015-08-09 LAB — PROCALCITONIN: Procalcitonin: <0.1 ng/mL

## 2015-08-09 LAB — MRSA PCR SCREENING: MRSA by PCR: NEGATIVE

## 2015-08-09 MED ORDER — LORAZEPAM 0.5 MG PO TABS
0.5000 mg | ORAL_TABLET | Freq: Every evening | ORAL | Status: DC | PRN
  Administered 2015-08-09 – 2015-08-10 (×2): 0.5 mg via ORAL
  Filled 2015-08-09 (×3): qty 1

## 2015-08-09 MED ORDER — KETOROLAC TROMETHAMINE 15 MG/ML IJ SOLN
15.0000 mg | Freq: Three times a day (TID) | INTRAMUSCULAR | Status: AC
  Administered 2015-08-09 – 2015-08-11 (×6): 15 mg via INTRAVENOUS
  Filled 2015-08-09 (×6): qty 1

## 2015-08-09 MED ORDER — BUDESONIDE-FORMOTEROL FUMARATE 160-4.5 MCG/ACT IN AERO
2.0000 | INHALATION_SPRAY | Freq: Two times a day (BID) | RESPIRATORY_TRACT | Status: DC
  Administered 2015-08-09 (×2): 2 via RESPIRATORY_TRACT
  Filled 2015-08-09: qty 6

## 2015-08-09 MED ORDER — IPRATROPIUM-ALBUTEROL 0.5-2.5 (3) MG/3ML IN SOLN
3.0000 mL | RESPIRATORY_TRACT | Status: DC
  Administered 2015-08-09 – 2015-08-11 (×12): 3 mL via RESPIRATORY_TRACT
  Filled 2015-08-09 (×13): qty 3

## 2015-08-09 MED ORDER — ALPRAZOLAM 0.25 MG PO TABS
0.2500 mg | ORAL_TABLET | Freq: Four times a day (QID) | ORAL | Status: DC | PRN
  Administered 2015-08-09 – 2015-08-11 (×8): 0.25 mg via ORAL
  Filled 2015-08-09 (×8): qty 1

## 2015-08-09 MED ORDER — ENOXAPARIN SODIUM 40 MG/0.4ML ~~LOC~~ SOLN
40.0000 mg | SUBCUTANEOUS | Status: DC
  Administered 2015-08-09 – 2015-08-12 (×4): 40 mg via SUBCUTANEOUS
  Filled 2015-08-09 (×4): qty 0.4

## 2015-08-09 NOTE — Care Management Note (Signed)
Case Management Note  Patient Details  Name: Terri Stephens MRN: [id number] Date of Birth: 07/28/1956  Subjective/Objective:    Adm w resp failure                Action/Plan: lives w fam, pcp dr Isaiah Serge   Expected Discharge Date:                 Expected Discharge Plan:     In-House Referral:     Discharge planning Services     Post Acute Care Choice:    Choice offered to:     DME Arranged:    DME Agency:     HH Arranged:    HH Agency:     Status of Service:     Medicare Important Message Given:    Date Medicare IM Given:    Medicare IM give by:    Date Additional Medicare IM Given:    Additional Medicare Important Message give by:     If discussed at Long Length of Stay Meetings, dates discussed:    Additional Comments:ur review done  Hanley Hays, RN 08/09/2015, 8:57 AM

## 2015-08-09 NOTE — Progress Notes (Signed)
PROGRESS NOTE  Terri Stephens LPF:[id number] DOB: [birth year] DOA: 08/08/2015 PCP: Chilton Greathouse, MD  Brief History 60 year old female with history of end-stage COPD chronically on 2 Lcannula and prednisone 30 mg daily presented with a four-day history of progressive shortness of breath and increasing cough with sputum production. The patient was recently discharged from the hospital on 06/27/2015 after acute exacerbation of COPD. During that hospitalization, the patient required BiPAP. She was discharged with a slow prednisone taper. Prior to that hospitalization, the patient was hospitalized between November 3 through 06/06/2015.  She denies any fevers, chills, chest pain, nausea, vomiting, diarrhea. Upon presentation, the patient was noted to be hypoxemic in the 80s. WBC was 13.0, lactic acid 1.26. Chest x-ray showed bibasilar heterogeneous opacities likely atelectasis. She was started on intravenous steroids and placed on BiPAP. The patient recently followed up with Dr. Isaiah Serge on on 07/07/2015 and started on Daliresp.    Assessment/Plan: Acute on chronic respiratory failure with hypoxia and hypercarbia  -Secondary to COPD exacerbation  -check procalcitonin  -Continue intravenous steroids--plan slow wean -Continue bronchodilators  -Wean oxygen to keep saturation between 88-92 percent  -presently stable on 3L  COPD exacerbation/COPD GOLD IV -pulmonary hygiene -continue Daliresp -Home oxygen at 2 L  -restart symbicort -Duonebs q 4 hours -check procalcitonin -continue levofloxacin for now  Tobacco abuse  -Tobacco cessation discussed  -nicoderm patch 14 mg -restarted tobacco use during Christmas holiday  Atypical chest pain -EKG -Cycle troponin  Anxiety/depression  -Continue Zoloft and Xanax  -pt appears reliable in terms of appropriate use of alprazolam -increase alprazolam to q 6hrs -lorazepam at hs prn anxiety  Chronic back pain -Patient tolerated intravenous  fentanyl during her last hospitalization although she gave history of she claims to have had mild tongue/lip swelling-she never sought any medical attention. -tolerating morphine po -has tolerated IV fentanyl in past -IV toradol x 48 hours  History of? Rheumatoid arthritis: Claims to have had arthritis-and was on Celebrex for a number of years while she was in [place]. Continue steroids/NSAIDs-will keep on low dose steroids till seen by rheumatology as outpatient  -monitor renal function on NSAID  Hypothyroidism  -Continue levothyroxine    Family Communication:   Pt at beside Disposition Plan:   Home 2-3 days       Procedures/Studies: Dg Chest Port 1 View  08/08/2015  CLINICAL DATA:  Patient with cough and COPD. EXAM: PORTABLE CHEST 1 VIEW COMPARISON:  Chest radiograph 06/22/2015. FINDINGS: Stable cardiac and mediastinal contours. Minimal heterogeneous opacities bilateral lung bases. No large area of pulmonary consolidation. No pleural effusion or pneumothorax. IMPRESSION: Basilar heterogeneous opacities favored represent atelectasis. Electronically Signed   By: Annia Belt M.D.   On: 08/08/2015 14:38         Subjective: Patient breathing better than yesterday. Denies any fevers, chills, nausea, vomiting, diarrhea, abdominal pain, dysuria, hematuria. Denies any rashes, headache, neck pain.  Objective: Filed Vitals:   08/09/15 0404 08/09/15 0730 08/09/15 0817 08/09/15 0830  BP:  155/87    Pulse: 81 73  80  Temp:  98.2 F (36.8 C)    TempSrc:  Oral    Resp: 21 15  15   Height:      Weight:      SpO2:  100% 100% 100%    Intake/Output Summary (Last 24 hours) at 08/09/15 1020 Last data filed at 08/09/15 0547  Gross per 24 hour  Intake    120 ml  Output  800 ml  Net   -680 ml   Weight change:  Exam:   General:  Pt is alert, follows commands appropriately, not in acute distress  HEENT: No icterus, No thrush, No neck mass, [place]/AT  Cardiovascular: RRR, S1/S2, no  rubs, no gallops  Respiratory: CTA bilaterally, no wheezing, no crackles, no rhonchi  Abdomen: Soft/+BS, non tender, non distended, no guarding  Extremities: No edema, No lymphangitis, No petechiae, No rashes, no synovitis  Data Reviewed: Basic Metabolic Panel:  Recent Labs Lab 08/08/15 1412 08/09/15 0255  NA 134* 133*  K 3.7 3.9  CL 91* 88*  CO2 31 34*  GLUCOSE 115* 121*  BUN 11 12  CREATININE 0.48 0.47  CALCIUM 9.1 9.0   Liver Function Tests: No results for input(s): AST, ALT, ALKPHOS, BILITOT, PROT, ALBUMIN in the last 168 hours. No results for input(s): LIPASE, AMYLASE in the last 168 hours. No results for input(s): AMMONIA in the last 168 hours. CBC:  Recent Labs Lab 08/08/15 1412 08/09/15 0255  WBC 13.0* 7.1  NEUTROABS 12.2*  --   HGB 11.2* 9.8*  HCT 33.7* 29.5*  MCV 92.8 91.9  PLT 323 305   Cardiac Enzymes:  Recent Labs Lab 08/08/15 1412  TROPONINI <0.03   BNP: Invalid input(s): POCBNP CBG: No results for input(s): GLUCAP in the last 168 hours.  Recent Results (from the past 240 hour(s))  MRSA PCR Screening     Status: None   Collection Time: 08/08/15  8:31 PM  Result Value Ref Range Status   MRSA by PCR NEGATIVE NEGATIVE Final    Comment:        The GeneXpert MRSA Assay (FDA approved for NASAL specimens only), is one component of a comprehensive MRSA colonization surveillance program. It is not intended to diagnose MRSA infection nor to guide or monitor treatment for MRSA infections.      Scheduled Meds: . albuterol  2.5 mg Nebulization Q4H  . aspirin EC  81 mg Oral Daily  . enoxaparin (LOVENOX) injection  30 mg Subcutaneous Q24H  . furosemide  20 mg Oral Daily  . levofloxacin (LEVAQUIN) IV  750 mg Intravenous Q24H  . levothyroxine  50 mcg Oral Daily  . methylPREDNISolone (SOLU-MEDROL) injection  60 mg Intravenous Q6H  . nicotine  14 mg Transdermal Daily  . roflumilast  500 mcg Oral Daily  . sertraline  100 mg Oral Daily  .  sodium chloride  3 mL Intravenous Q12H   Continuous Infusions:    Laiya Wisby, DO  [facility] Pager [phone]  If 7PM-7AM, please contact night-coverage [email or web address] Password [email or web address] 08/09/2015, 10:20 AM   LOS: 1 day

## 2015-08-09 NOTE — Progress Notes (Signed)
Pt appears anxious, pt c/o SOB, also c/o not getting her meds (ativan and symbicort).  I explained to her that she is on IV steroids, but she is still concerned about not getting symbicort.  RN in room-aware.  Pt received her meds, then placed on bipap.  Pt tol bipap well presently, w/ no pt. Complaints on bipap.

## 2015-08-09 NOTE — Progress Notes (Signed)
At this time, pt does not wish to use the BiPAP.  Pt want to continue resting with the [place].  If pt has increase WOB, then she states that she will use the machine.  RT will continue to monitor and access pt.

## 2015-08-09 NOTE — Evaluation (Signed)
Physical Therapy Evaluation Patient Details Name: Terri Stephens MRN: [id number] DOB: 07/23/1956 Today's Date: 08/09/2015   History of Present Illness  Terri Stephens is a 60 y/o F with history of severe COPD with chronic respiratory failure on chronic steroids, hypothyroidism, anxiety, tobacco abuse, severe protein calorie malnutrition, h/o hemoptysis 06/2015, chronic back/chest pain (?RA) who presented to [facility] 08/08/2015 with acute on chronic respiratory failure.   Clinical Impression  Patient presents with decreased independence due to deficits listed in PT problem list.  She will benefit from skilled PT in the acute setting to allow return home following SNF level rehab stay.  Needed assist previous to admission from a friend who patient reports already helping other of her family members.  Will benefit from 24 hour care until strong enough to return home alone.    Follow Up Recommendations SNF    Equipment Recommendations  None recommended by PT    Recommendations for Other Services       Precautions / Restrictions Precautions Precautions: Fall Precaution Comments: frequent anxiety attacks      Mobility  Bed Mobility Overal bed mobility: Needs Assistance Bed Mobility: Supine to Sit     Supine to sit: Min guard     General bed mobility comments: anxious and quick with movements, assist for safety with lines; RN in room bathing patient due to incontinence in bed and wanted to eat lunch first piror to cleaning up  Transfers Overall transfer level: Needs assistance Equipment used: 2 person hand held assist Transfers: Sit to/from [employer] Sit to Stand: Min assist;+2 safety/equipment Stand pivot transfers: Min assist;+2 safety/equipment       General transfer comment: assist due to weakness, anxiety and SOB  Ambulation/Gait                Stairs            Wheelchair Mobility    Modified Rankin (Stroke Patients Only)        Balance Overall balance assessment: Needs assistance         Standing balance support: No upper extremity supported Standing balance-Leahy Scale: Poor Standing balance comment: needed physical assist for safety with balance                             Pertinent Vitals/Pain Pain Assessment: No/denies pain    Home Living Family/patient expects to be discharged to:: Private residence Living Arrangements: Alone Available Help at Discharge: Friend(s) Type of Home: House Home Access: Stairs to enter Entrance Stairs-Rails: Right Entrance Stairs-Number of Steps: 4 Home Layout: One level Home Equipment: Walker - 4 wheels      Prior Function Level of Independence: Needs assistance   Gait / Transfers Assistance Needed: Reports using rollator for mobility in community. Furniture walker for household ambulation. Wears 2-3L/min 02 at home.  ADL's / Homemaking Assistance Needed: Requires assist for ADLs for the last few months from a g/f.        Hand Dominance        Extremity/Trunk Assessment   Upper Extremity Assessment: Generalized weakness           Lower Extremity Assessment: Generalized weakness         Communication   Communication: No difficulties  Cognition Arousal/Alertness: Awake/alert Behavior During Therapy: Anxious Overall Cognitive Status: Within Functional Limits for tasks assessed  General Comments General comments (skin integrity, edema, etc.): pt anxious and reported sitting EOB not feeling well though SpO2 100% on 3L O2 but HR up to 130's RN gave meds once pt in chair    Exercises        Assessment/Plan    PT Assessment Patient needs continued PT services  PT Diagnosis Difficulty walking;Generalized weakness   PT Problem List Decreased strength;Decreased activity tolerance;Decreased balance;Decreased mobility;Cardiopulmonary status limiting activity  PT Treatment Interventions DME  instruction;Gait training;Balance training;Functional mobility training;Stair training;Patient/family education;Therapeutic activities;Therapeutic exercise   PT Goals (Current goals can be found in the Care Plan section) Acute Rehab PT Goals Patient Stated Goal: To get help so don't have to overwork my friend PT Goal Formulation: With patient Time For Goal Achievement: 08/26/2015 Potential to Achieve Goals: Fair    Frequency Min 3X/week   Barriers to discharge Decreased caregiver support      Co-evaluation               End of Session Equipment Utilized During Treatment: Oxygen Activity Tolerance: Patient limited by fatigue;Other (comment) (limited by anxiety) Patient left: in chair;with call bell/phone within reach;with nursing/sitter in room           Time: 1320-1353 PT Time Calculation (min) (ACUTE ONLY): 33 min   Charges:   PT Evaluation $PT Eval Moderate Complexity: 1 Procedure PT Treatments $Therapeutic Activity: 8-22 mins   PT G Codes:        Terri Stephens 08/12/2015, 3:19 PM  Terri Stephens, PT [phone] 2015-08-12

## 2015-08-09 NOTE — Progress Notes (Signed)
Reviewed EKG Showed sinus with ST elevation V3-V6 -pt had prior EKG on 06/03/15 with similar changes -clinical hx sounds more like atypical chest pain -pt "always" has chest pain -troponin on 08/08/15 @1412  was neg -repeat troponins already ordered -do NOT feel pt truly having STEMI -I have consulted cardiology for opinion -pt is hemodynamically stable -pt states she has been cathed in past for these similar EKG changes and stated she "didn't have blockages".  DTat

## 2015-08-09 NOTE — Progress Notes (Signed)
Pt requesting PRN ibprofen and iv ativan for anxiety.  PRN xanax given this morning, but pt still requesting ativan. Will continue to monitor pt and update as needed.

## 2015-08-09 NOTE — Consult Note (Signed)
Cardiology Consultation Note    Patient ID: Terri Stephens, MRN: [id number], DOB/AGE: 58-14-1957 58 y.o. Admit date: 08/08/2015   Date of Consult: 08/09/2015 Primary Physician: Chilton Greathouse, MD Primary Cardiologist: New  Chief Complaint: SOB Reason for Consultation: abnormal EKG Requesting MD: Dr. Arbutus Leas  HPI: Terri Stephens is a 60 y/o F with history of severe COPD with chronic respiratory failure on chronic steroids, hypothyroidism, anxiety, tobacco abuse, severe protein calorie malnutrition, h/o hemoptysis 06/2015, chronic back/chest pain (?RA) who presented to [facility] 08/08/2015 with acute on chronic respiratory failure. She has no prior cardiac diagnoses except prior abnormal EKG. She reports that in 09/2014 she presented to [facility] in [place] where she lived at the time with acute SOB. They deemed her EKG acute and rushed her to the cath lab where she says she was told her coronary arteries were totally normal. Remote echo in 2008 showed EF 55-60%, hypokinesis of septal wall, findings c/w diastolic dysfunction. CTA 06/21/15 showed no PE or mass, but did show advanced COPD and aortic atherosclerosis. Pulmonology notes indicate she has been referred to hospice but declined their services.   She was admitted yesterday with 58-4 day history of progressive SOB, DOE, increased sputum production and coughing. She required bipap and was started on steroids/nebs/antibiotics. She had restarted smoking during Christmas holiday. She has also noticed a sensation of chest burning when she coughs over the last several weeks. This is not particularly associated with exertion. She also reports a chronic dull ache "from my breasts down" to her flanks and back, which was previously deemed rheumatoid arthritis. Labs reveal worsening anemia (Hgb 9.8, prev 10-11 range), Na 133, Cr 0.47, lactate 1.26, initial troponin neg, TSH 0.264. CXR with basilar heterogeneous opacities favored represent  atelectasis. She denies any bleeding. EKG was ordered today which showed early repol-type pattern with early ST upsloping inferiorly as well as V3-V6. This is similar to tracing in 06/2015.   Past Medical History  Diagnosis Date  . COPD (chronic obstructive pulmonary disease) (HCC)     dx'ed at admission for exacerbation 07/2007.  normal Alpha 1 Antitrypsin level 07/2007  . Asthma     labeled as asthma prior to Dec 2008.  clinically she has COPD  . Hypothyroidism   . Hyponatremia     developed 07/2007 during hospitalization.  resolved  . Tobacco abuse   . Chronic respiratory failure (HCC)   . Anxiety   . Protein calorie malnutrition (HCC)   . Hemoptysis     06/2015 in setting of COPD exacerbation  . Chronic back pain       Surgical History:  Past Surgical History  Procedure Laterality Date  . Cardiac catheterization  March 2016     Home Meds: Prior to Admission medications   Medication Sig Start Date End Date Taking? Authorizing Provider  albuterol (PROAIR HFA) 108 (90 BASE) MCG/ACT inhaler Inhale 1-2 puffs into the lungs every 6 (six) hours as needed for wheezing.    Yes Historical Provider, MD  albuterol (PROVENTIL HFA;VENTOLIN HFA) 108 (90 Base) MCG/ACT inhaler Inhale 1 puff into the lungs every 6 (six) hours as needed for wheezing or shortness of breath.   Yes Historical Provider, MD  albuterol (PROVENTIL) (2.5 MG/3ML) 0.083% nebulizer solution Take 3 mLs (2.5 mg total) by nebulization every 6 (six) hours as needed for wheezing or shortness of breath. 06/11/15  Yes Coralyn Helling, MD  ALPRAZolam Prudy Feeler) 0.25 MG tablet Take 1 tablet (0.25 mg total) by mouth  3 (three) times daily as needed for anxiety. 06/06/15  Yes Bernadene Person, NP  aspirin EC 81 MG tablet Take 81 mg by mouth daily.   Yes Historical Provider, MD  budesonide-formoterol (SYMBICORT) 160-4.5 MCG/ACT inhaler Inhale 2 puffs into the lungs 2 (two) times daily. 06/06/15  Yes Bernadene Person, NP  furosemide  (LASIX) 20 MG tablet TAKE 1 TABLET BY MOUTH EVERY DAY 07/21/15  Yes Praveen Mannam, MD  guaiFENesin (MUCINEX) 600 MG 12 hr tablet Take 1,200 mg by mouth 2 (two) times daily.   Yes Historical Provider, MD  ibuprofen (ADVIL,MOTRIN) 800 MG tablet Take 800 mg by mouth every 8 (eight) hours as needed.   Yes Historical Provider, MD  ipratropium-albuterol (DUONEB) 0.5-2.5 (3) MG/3ML SOLN Take 3 mLs by nebulization every 4 (four) hours. 06/27/15  Yes Leroy Sea, MD  levothyroxine (SYNTHROID, LEVOTHROID) 50 MCG tablet Take 50 mcg by mouth daily.   Yes Historical Provider, MD  Morphine Sulfate (MORPHINE CONCENTRATE) 10 MG/0.5ML SOLN concentrated solution Take 0.5 mLs (10 mg total) by mouth every 3 (three) hours as needed for moderate pain or severe pain. 07/09/15  Yes Praveen Mannam, MD  Multiple Vitamins-Minerals (MULTIVITAMIN & MINERAL PO) Take 1 tablet by mouth daily.   Yes Historical Provider, MD  predniSONE (DELTASONE) 10 MG tablet Take 3 tablets (30 mg total) by mouth daily with breakfast. 07/06/15  Yes Praveen Mannam, MD  roflumilast (DALIRESP) 500 MCG TABS tablet Take 1 tablet (500 mcg total) by mouth daily. 07/06/15  Yes Praveen Mannam, MD  sertraline (ZOLOFT) 100 MG tablet Take 100 mg by mouth daily.   Yes Historical Provider, MD  tiotropium (SPIRIVA) 18 MCG inhalation capsule Place 18 mcg into inhaler and inhale daily.   Yes Historical Provider, MD  nicotine (NICODERM CQ - DOSED IN MG/24 HOURS) 14 mg/24hr patch Place 1 patch (14 mg total) onto the skin daily. Patient taking differently: Place 14 mg onto the skin daily as needed.  06/27/15   Leroy Sea, MD  predniSONE (DELTASONE) 5 MG tablet Label  & dispense according to the schedule below. 10 Pills PO for 3 days then, 8 Pills PO for 3 days, 6 Pills PO for 3 days, 4 Pills PO for 3 days, then continue taking 10 mg prednisone daily as before to you see your rheumatologist and lung doctor. Patient not taking: Reported on 08/08/2015 06/27/15    Leroy Sea, MD  valACYclovir (VALTREX) 1000 MG tablet Take 1,000 mg by mouth 3 (three) times daily as needed (outbreaks).     Historical Provider, MD    Inpatient Medications:  . aspirin EC  81 mg Oral Daily  . budesonide-formoterol  2 puff Inhalation BID  . enoxaparin (LOVENOX) injection  40 mg Subcutaneous Q24H  . furosemide  20 mg Oral Daily  . ipratropium-albuterol  3 mL Nebulization 6 times per day  . ketorolac  15 mg Intravenous 3 times per day  . levofloxacin (LEVAQUIN) IV  750 mg Intravenous Q24H  . levothyroxine  50 mcg Oral Daily  . methylPREDNISolone (SOLU-MEDROL) injection  60 mg Intravenous Q6H  . nicotine  14 mg Transdermal Daily  . roflumilast  500 mcg Oral Daily  . sertraline  100 mg Oral Daily  . sodium chloride  3 mL Intravenous Q12H      Allergies:  Allergies  Allergen Reactions  . Bee Venom Anaphylaxis  . Oxycodone Hcl Shortness Of Breath, Itching and Swelling    REACTION: All "codones" in any form  Social History   Social History  . Marital Status: Married    Spouse Name: N/A  . Number of Children: N/A  . Years of Education: N/A   Occupational History  . Not on file.   Social History Main Topics  . Smoking status: Current Every Day Smoker -- 0.50 packs/day for 40 years    Types: Cigarettes  . Smokeless tobacco: Current User  . Alcohol Use: 1.2 oz/week    2 Cans of beer, 0 Standard drinks or equivalent per week     Comment: rare  . Drug Use: No  . Sexual Activity: Not on file   Other Topics Concern  . Not on file   Social History Narrative   Originally from [place]   Has lived in [place] for the last 60yrs   Married, no kids   Husband is in [place]   Spouse smokes   Retired Agricultural engineer   Exposure to [employer] smoke from [employer] burning in [place]     Family History  Problem Relation Age of Onset  . Emphysema Mother   . Cancer - Lung Father   . Heart disease Father   . Heart disease Paternal Grandmother   . Clotting  disorder Father   . Cancer Maternal Grandmother     breast     Review of Systems: All other systems reviewed and are otherwise negative except as noted above.  Labs:  Recent Labs  08/08/15 1412  TROPONINI <0.03   Lab Results  Component Value Date   WBC 7.1 08/09/2015   HGB 9.8* 08/09/2015   HCT 29.5* 08/09/2015   MCV 91.9 08/09/2015   PLT 305 08/09/2015    Recent Labs Lab 08/09/15 0255  NA 133*  K 3.9  CL 88*  CO2 34*  BUN 12  CREATININE 0.47  CALCIUM 9.0  GLUCOSE 121*    Radiology/Studies:  Dg Chest Port 1 View  08/08/2015  CLINICAL DATA:  Patient with cough and COPD. EXAM: PORTABLE CHEST 1 VIEW COMPARISON:  Chest radiograph 06/22/2015. FINDINGS: Stable cardiac and mediastinal contours. Minimal heterogeneous opacities bilateral lung bases. No large area of pulmonary consolidation. No pleural effusion or pneumothorax. IMPRESSION: Basilar heterogeneous opacities favored represent atelectasis. Electronically Signed   By: Annia Belt M.D.   On: 08/08/2015 14:38    Wt Readings from Last 3 Encounters:  08/09/15 124 lb 1.9 oz (56.3 kg)  07/06/15 127 lb (57.607 kg)  06/27/15 125 lb 3.5 oz (56.8 kg)    EKG: NSR 80bpm, early ST upslopig inferiorly as well as V3-V6  Physical Exam: Blood pressure 117/81, pulse 81, temperature 98.2 F (36.8 C), temperature source Oral, resp. rate 15, height 5\' 8"  (1.727 m), weight 124 lb 1.9 oz (56.3 kg), SpO2 100 %. Body mass index is 18.88 kg/(m^2). General: Well developed, well nourished WF in no acute distress. Head: Normocephalic, atraumatic, sclera non-icteric, no xanthomas, nares are without discharge.  Neck: Negative for carotid bruits. JVD not elevated. Lungs: Sporadic scattered wheezing, moderate air movement, no rales or rhonchi. Heart: RRR with S1 S2. No murmurs, rubs, or gallops appreciated. Abdomen: Soft, non-tender, non-distended with normoactive bowel sounds. No hepatomegaly. No rebound/guarding. No obvious abdominal  masses. Msk:  Strength and tone appear normal for age. Extremities: No clubbing or cyanosis. No edema.  Distal pedal pulses are 2+ and equal bilaterally. Neuro: Alert and oriented X 3. No facial asymmetry. No focal deficit. Moves all extremities spontaneously. Psych:  Responds to questions appropriately with a normal affect.  Assessment and Plan   1. Acute on chronic respiratory failure due to acute exacerbation of severe COPD - per IM.   2. Atypical chest pain and abnormal EKG - the patient reports a history of abnormal EKG which prompted emergent cath in 09/2014 which she says showed normal coronary arteries. EKG today is similar to 06/2015, suggestive of early repolarization. There are so reciprocal ST changes. Agree that chest pain is atypical - suspect musculoskeletal due to coughing. One more troponin should be sufficient to trend. Per review with Dr. Patty Sermons, no further workup felt to be indicated at this time. Have requested the floor try to obtain cath report from 09/2014 so we can formally review and scan in chart.  3. Hypothyroidism - abnormal TSH noted. Further adjustment of medication per internal medicine.  4. Tobacco abuse - counseled regarding cessation.   Signed, Laurann Montana PA-C 08/09/2015, 12:13 PM Pager: [phone] Agree with above assessment and plan.  I have personally reviewed her EKGs from November and from this admission and there is no significant change in the pattern of benign early repolarization.  I expect that the EKG done in [place] which led to her cardiac catheterization showed similar changes of ST segment elevation consistent with benign early repolarization.  We have requested that EKG.  Her chest discomfort is atypical and I have a low index of suspicion that it is cardiac.  I suspect that it is related to her exacerbation of COPD and from hard coughing.  She states that her abdomen is sore as well as her chest from coughing.  Her physical examination  reveals changes consistent with severe COPD with distant breath sounds and with expiratory wheezing bilaterally.  The heart sounds are distant.  No murmur gallop rub or click. Plan: No further cardiac workup indicated at this time.  She definitely needs to stop smoking again because of her advanced COPD.

## 2015-08-09 NOTE — Progress Notes (Signed)
Nutrition Brief Note  RD consult to assess nutrient needs/status per COPD GOLD Protocol.   Wt Readings from Last 15 Encounters:  08/09/15 124 lb 1.9 oz (56.3 kg)  07/06/15 127 lb (57.607 kg)  06/27/15 125 lb 3.5 oz (56.8 kg)  06/17/15 128 lb 12.8 oz (58.423 kg)  06/05/15 116 lb 3.2 oz (52.708 kg)  08/12/07 133 lb 12.8 oz (60.691 kg)   Terri Stephens is a 60 y.o. female with a Past Medical History of COPD Hypothyroid, smoker who presents with Acute on chronic respiratory failure.   Pt admitted with acute on chronic respiratory failure.   Hx obtained from pt at bedside, who was consuming a bagel at time of visit. She reports appetite is good currently and PTA, however, did not eat much of her breakfast due to recently being weaned off Bi-pap. She consumed 4-5 small meals per day ("I can't eat a lot at lunch because of my breathing"). Typical meals consist of soup and corn bread and fruit cups. She reports she tries to ensure she gets adequate protein in her diet and has many friends that assist her with providing meals.   Pt reveals she generally eats a regular diet at home at questions why she is on a heart healthy diet. Discussed principles and rationale for prescribed diet; pt accepted explanation.   She reports her UBW is around 125# and denies any recent weight loss.   Nutrition-Focused physical exam completed. Findings are no fat depletion, mild muscle depletion, and no edema. Pt lives by herself and is active at baseline, but reports that she was very inactive over the past 3 days and had a friend stay with her due to functional decline.   Labs reviewed: Na: 133 (on IV supplementation), Glucose: 121 (on solumedrol).   Body mass index is 18.88 kg/(m^2). Patient meets criteria for normal weight range based on current BMI.   Current diet order is Heart Healthy, patient is consuming approximately n/a% of meals at this time. Labs and medications reviewed.   No nutrition interventions  warranted at this time. If nutrition issues arise, please consult RD.   Shermar Friedland A. Mayford Knife, RD, LDN, CDE Pager: [phone] After hours Pager: [phone]

## 2015-08-09 NOTE — Progress Notes (Signed)
Notified md pt anxious and needing to go back on bipap.  New orders received. Will continue to monitor. Terri Stephens

## 2015-08-09 NOTE — Progress Notes (Signed)
EKG CRITICAL VALUE     12 lead EKG performed.  Critical value noted. Shearon Balo, RN notified.   Wandalee Ferdinand, CCT 08/09/2015 11:41 AM

## 2015-08-10 DIAGNOSIS — J9621 Acute and chronic respiratory failure with hypoxia: Secondary | ICD-10-CM

## 2015-08-10 DIAGNOSIS — J441 Chronic obstructive pulmonary disease with (acute) exacerbation: Secondary | ICD-10-CM

## 2015-08-10 DIAGNOSIS — F17200 Nicotine dependence, unspecified, uncomplicated: Secondary | ICD-10-CM

## 2015-08-10 DIAGNOSIS — J449 Chronic obstructive pulmonary disease, unspecified: Secondary | ICD-10-CM

## 2015-08-10 LAB — CBC
HCT: 30.1 % — ABNORMAL LOW (ref 36.0–46.0)
HEMOGLOBIN: 10 g/dL — AB (ref 12.0–15.0)
MCH: 31 pg (ref 26.0–34.0)
MCHC: 33.2 g/dL (ref 30.0–36.0)
MCV: 93.2 fL (ref 78.0–100.0)
PLATELETS: 303 10*3/uL (ref 150–400)
RBC: 3.23 MIL/uL — ABNORMAL LOW (ref 3.87–5.11)
RDW: 12.9 % (ref 11.5–15.5)
WBC: 10.5 10*3/uL (ref 4.0–10.5)

## 2015-08-10 LAB — BASIC METABOLIC PANEL
ANION GAP: 8 (ref 5–15)
BUN: 21 mg/dL — AB (ref 6–20)
CALCIUM: 9.2 mg/dL (ref 8.9–10.3)
CO2: 35 mmol/L — AB (ref 22–32)
CREATININE: 0.49 mg/dL (ref 0.44–1.00)
Chloride: 90 mmol/L — ABNORMAL LOW (ref 101–111)
GFR calc Af Amer: >60 mL/min (ref >60)
GLUCOSE: 135 mg/dL — AB (ref 65–99)
Potassium: 4.5 mmol/L (ref 3.5–5.1)
Sodium: 133 mmol/L — ABNORMAL LOW (ref 135–145)

## 2015-08-10 MED ORDER — GUAIFENESIN ER 600 MG PO TB12
1200.0000 mg | ORAL_TABLET | Freq: Two times a day (BID) | ORAL | Status: DC
  Administered 2015-08-10 – 2015-08-13 (×6): 1200 mg via ORAL
  Filled 2015-08-10 (×6): qty 2

## 2015-08-10 MED ORDER — LEVOFLOXACIN 750 MG PO TABS
750.0000 mg | ORAL_TABLET | Freq: Every day | ORAL | Status: AC
  Administered 2015-08-10 – 2015-08-12 (×3): 750 mg via ORAL
  Filled 2015-08-10 (×3): qty 1

## 2015-08-10 MED ORDER — MORPHINE SULFATE (PF) 2 MG/ML IV SOLN
2.0000 mg | INTRAVENOUS | Status: DC | PRN
  Administered 2015-08-10 (×2): 2 mg via INTRAVENOUS
  Administered 2015-08-11: 4 mg via INTRAVENOUS
  Administered 2015-08-11 (×2): 2 mg via INTRAVENOUS
  Filled 2015-08-10 (×4): qty 1
  Filled 2015-08-10: qty 2

## 2015-08-10 MED ORDER — BUDESONIDE 0.25 MG/2ML IN SUSP
0.2500 mg | Freq: Two times a day (BID) | RESPIRATORY_TRACT | Status: DC
  Filled 2015-08-10: qty 2

## 2015-08-10 MED ORDER — BUDESONIDE 0.5 MG/2ML IN SUSP
0.5000 mg | Freq: Two times a day (BID) | RESPIRATORY_TRACT | Status: DC
  Administered 2015-08-10: 0.5 mg via RESPIRATORY_TRACT
  Filled 2015-08-10 (×2): qty 2

## 2015-08-10 NOTE — Consult Note (Signed)
Name: Terri Stephens MRN: [id number] DOB: [birth year]    ADMISSION DATE:  08/08/2015 CONSULTATION DATE:  08/10/15  REFERRING MD :  Tat  CHIEF COMPLAINT:  SOB  BRIEF PATIENT DESCRIPTION: 60 y.o. female with severe COPD (Gold IV and followed by Dr. Isaiah Serge), admitted to [facility] 01/08 with AECOPD.  Recently made DNR / DNI and referred to hospice.  PCCM called in consultation 01/10.  SIGNIFICANT EVENTS  07/06/15 > seen in office by Dr. Isaiah Serge, made DNR / DNI and referred to hospice at her request. 01/08 > admitted with AECOPD 01/10 > PCCM consulted.  STUDIES:  CXR 01/08 > atx.   HISTORY OF PRESENT ILLNESS:  Terri Stephens is a 60 y.o. female with a PMH as outlined below including severe COPD (Gold IV).  She is on chronic O2 and is followed by Dr. Isaiah Serge as an outpatient.  She had been followed by Dr. Marchelle Gearing in the past but then moved to [facility].  She recently returned to [facility] as she felt that she was not getting appropriate care in [place]. She was last seen in the office 07/06/15 and at the time, she had gotten to the point where she couldn't even walk across the room without having to stop and rest due to dyspnea.   During that office visit, she also informed Dr. Isaiah Serge of her wishes to never be intubated or undergo resuscitation.  Orders were subsequently placed for DNR.  She requested and was referred to hospice but then declined when hospice attempted to see her.  She requested to wait until she needs them.  On 08/07/14, she presented to [facility] ED with SOB and productive cough with increased sputum production.  She was admitted for AECOPD and was started on abx, steroids, BiPAP.   PFT's from 07/17/07:  FEV1 0.68 (23% pred).    PAST MEDICAL HISTORY :   has a past medical history of COPD (chronic obstructive pulmonary disease) (HCC); Asthma; Hypothyroidism; Hyponatremia; Tobacco abuse; Chronic respiratory failure (HCC); Anxiety; Protein calorie malnutrition (HCC); Hemoptysis; and Chronic  back pain.  has past surgical history that includes Cardiac catheterization (March 2016). Prior to Admission medications   Medication Sig Start Date End Date Taking? Authorizing Provider  albuterol (PROAIR HFA) 108 (90 BASE) MCG/ACT inhaler Inhale 1-2 puffs into the lungs every 6 (six) hours as needed for wheezing.    Yes Historical Provider, MD  albuterol (PROVENTIL HFA;VENTOLIN HFA) 108 (90 Base) MCG/ACT inhaler Inhale 1 puff into the lungs every 6 (six) hours as needed for wheezing or shortness of breath.   Yes Historical Provider, MD  albuterol (PROVENTIL) (2.5 MG/3ML) 0.083% nebulizer solution Take 3 mLs (2.5 mg total) by nebulization every 6 (six) hours as needed for wheezing or shortness of breath. 06/11/15  Yes Coralyn Helling, MD  ALPRAZolam (XANAX) 0.25 MG tablet Take 1 tablet (0.25 mg total) by mouth 3 (three) times daily as needed for anxiety. 06/06/15  Yes Bernadene Person, NP  aspirin EC 81 MG tablet Take 81 mg by mouth daily.   Yes Historical Provider, MD  budesonide-formoterol (SYMBICORT) 160-4.5 MCG/ACT inhaler Inhale 2 puffs into the lungs 2 (two) times daily. 06/06/15  Yes Bernadene Person, NP  furosemide (LASIX) 20 MG tablet TAKE 1 TABLET BY MOUTH EVERY DAY 07/21/15  Yes Praveen Mannam, MD  guaiFENesin (MUCINEX) 600 MG 12 hr tablet Take 1,200 mg by mouth 2 (two) times daily.   Yes Historical Provider, MD  ibuprofen (ADVIL,MOTRIN) 800 MG tablet Take 800 mg by mouth every 8 (  eight) hours as needed.   Yes Historical Provider, MD  ipratropium-albuterol (DUONEB) 0.5-2.5 (3) MG/3ML SOLN Take 3 mLs by nebulization every 4 (four) hours. 06/27/15  Yes Leroy Sea, MD  levothyroxine (SYNTHROID, LEVOTHROID) 50 MCG tablet Take 50 mcg by mouth daily.   Yes Historical Provider, MD  Morphine Sulfate (MORPHINE CONCENTRATE) 10 MG/0.5ML SOLN concentrated solution Take 0.5 mLs (10 mg total) by mouth every 3 (three) hours as needed for moderate pain or severe pain. 07/09/15  Yes Praveen Mannam,  MD  Multiple Vitamins-Minerals (MULTIVITAMIN & MINERAL PO) Take 1 tablet by mouth daily.   Yes Historical Provider, MD  predniSONE (DELTASONE) 10 MG tablet Take 3 tablets (30 mg total) by mouth daily with breakfast. 07/06/15  Yes Praveen Mannam, MD  roflumilast (DALIRESP) 500 MCG TABS tablet Take 1 tablet (500 mcg total) by mouth daily. 07/06/15  Yes Praveen Mannam, MD  sertraline (ZOLOFT) 100 MG tablet Take 100 mg by mouth daily.   Yes Historical Provider, MD  tiotropium (SPIRIVA) 18 MCG inhalation capsule Place 18 mcg into inhaler and inhale daily.   Yes Historical Provider, MD  nicotine (NICODERM CQ - DOSED IN MG/24 HOURS) 14 mg/24hr patch Place 1 patch (14 mg total) onto the skin daily. Patient taking differently: Place 14 mg onto the skin daily as needed.  06/27/15   Leroy Sea, MD  predniSONE (DELTASONE) 5 MG tablet Label  & dispense according to the schedule below. 10 Pills PO for 3 days then, 8 Pills PO for 3 days, 6 Pills PO for 3 days, 4 Pills PO for 3 days, then continue taking 10 mg prednisone daily as before to you see your rheumatologist and lung doctor. Patient not taking: Reported on 08/08/2015 06/27/15   Leroy Sea, MD  valACYclovir (VALTREX) 1000 MG tablet Take 1,000 mg by mouth 3 (three) times daily as needed (outbreaks).     Historical Provider, MD   Allergies  Allergen Reactions  . Bee Venom Anaphylaxis  . Oxycodone Hcl Shortness Of Breath, Itching and Swelling    REACTION: All "codones" in any form    FAMILY HISTORY:  family history includes Cancer in her maternal grandmother; Cancer - Lung in her father; Clotting disorder in her father; Emphysema in her mother; Heart disease in her father and paternal grandmother. SOCIAL HISTORY:  reports that she has been smoking Cigarettes.  She has a 20 pack-year smoking history. She uses smokeless tobacco. She reports that she drinks about 1.2 oz of alcohol per week. She reports that she does not use illicit drugs.  REVIEW  OF SYSTEMS:   All negative; except for those that are bolded, which indicate positives.  Constitutional: weight loss, weight gain, night sweats, fevers, chills, fatigue, weakness.  HEENT: headaches, sore throat, sneezing, nasal congestion, post nasal drip, difficulty swallowing, tooth/dental problems, visual complaints, visual changes, ear aches. Neuro: difficulty with speech, weakness, numbness, ataxia. CV:  chest pain, orthopnea, PND, swelling in lower extremities, dizziness, palpitations, syncope.  Resp: cough, hemoptysis, dyspnea, wheezing. GI  heartburn, indigestion, abdominal pain, nausea, vomiting, diarrhea, constipation, change in bowel habits, loss of appetite, hematemesis, melena, hematochezia.  GU: dysuria, change in color of urine, urgency or frequency, flank pain, hematuria. MSK: joint pain or swelling, decreased range of motion. Psych: change in mood or affect, depression, anxiety, suicidal ideations, homicidal ideations. Skin: rash, itching, bruising.    SUBJECTIVE:  Reports breathing is easier on BiPAP.  Is requesting breathing Rx.  VITAL SIGNS: Temp:  [97.4 F (36.3 C)-98.6  F (37 C)] 98.6 F (37 C) (01/10 0735) Pulse Rate:  [73-101] 77 (01/10 0750) Resp:  [15-23] 18 (01/10 0750) BP: (112-144)/(74-125) 144/125 mmHg (01/10 0750) SpO2:  [20 %-100 %] 99 % (01/10 0750) FiO2 (%):  [40 %] 40 % (01/10 0748) Weight:  [57.38 kg (126 lb 8 oz)] 57.38 kg (126 lb 8 oz) (01/10 0315)  PHYSICAL EXAMINATION: General: Chronically ill appearing middle aged female, resting in bed, in NAD. Neuro: A&O x 3, non-focal. Anxious. HEENT: [place]/AT. PERRL, sclerae anicteric. BiPAP in place. Cardiovascular: RRR, no M/R/G.  Lungs: Respirations shallow and labored.  Diffuse wheezing. Abdomen: BS x 4, soft, NT/ND.  Musculoskeletal: No gross deformities, no edema.  Skin: Intact, warm, no rashes.     Recent Labs Lab 08/08/15 1412 08/09/15 0255 08/10/15 0220  NA 134* 133* 133*  K 3.7 3.9  4.5  CL 91* 88* 90*  CO2 31 34* 35*  BUN 11 12 21*  CREATININE 0.48 0.47 0.49  GLUCOSE 115* 121* 135*    Recent Labs Lab 08/08/15 1412 08/09/15 0255 08/10/15 0220  HGB 11.2* 9.8* 10.0*  HCT 33.7* 29.5* 30.1*  WBC 13.0* 7.1 10.5  PLT 323 305 303   Dg Chest Port 1 View  08/08/2015  CLINICAL DATA:  Patient with cough and COPD. EXAM: PORTABLE CHEST 1 VIEW COMPARISON:  Chest radiograph 06/22/2015. FINDINGS: Stable cardiac and mediastinal contours. Minimal heterogeneous opacities bilateral lung bases. No large area of pulmonary consolidation. No pleural effusion or pneumothorax. IMPRESSION: Basilar heterogeneous opacities favored represent atelectasis. Electronically Signed   By: Annia Belt M.D.   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 Isaiah Serge) - recently made DNR / DNI and referred to hospice. Tobacco use disorder - reportedly just started back over Christmas holidays. DNR / DNI Status. Plan: Continue BiPAP as needed.  Supplemental O2 when not on BiPAP to maintain SpO2 > 88%. Continue BD's, IV steroids. Change morphine to IV. Pulmonary hygiene. CXR intermittently. Tobacco cessation counseling. Palliative care consult again / hospice upon discharge.  Rest per primary team.  Unfortunately, Mrs. Giles has end stage COPD and given the severity of her disease along with her DNR / DNI status. Unclear that there are any relevant changes to make here. Agree with revisiting hospice, using BiPAP prn for WOB and comfort.    Rutherford Guys, [place] [facility] Pager: [phone]  or [phone] 08/10/2015, 10:35 AM   Attending Note:  I have examined patient, reviewed labs, studies and notes. I have discussed the case with Ihor Dow, and I agree with the data and plans as amended above. Severe end stage COPD with acute on chronic resp failure, continues to smoke. Agree with morphine  as ajunct treatment for her SOB. Continue steroids adn BD's. Use BiPAP prn. She would benefit and qualify for home hospice.   Levy Pupa, MD, PhD 08/10/2015, 1:04 PM [place] [facility] [phone] or if no answer [phone]

## 2015-08-10 NOTE — Progress Notes (Signed)
PROGRESS NOTE  Terri Stephens ZOX:[id number] DOB: [birth year] DOA: 08/08/2015 PCP: Chilton Greathouse, MD   Brief History 60 year old female with history of end-stage COPD chronically on 2 Lcannula and prednisone 30 mg daily presented with a four-day history of progressive shortness of breath and increasing cough with sputum production. The patient was recently discharged from the hospital on 06/27/2015 after acute exacerbation of COPD. During that hospitalization, the patient required BiPAP. She was discharged with a slow prednisone taper. Prior to that hospitalization, the patient was hospitalized between November 3 through 06/06/2015. She denies any fevers, chills, chest pain, nausea, vomiting, diarrhea. Upon presentation, the patient was noted to be hypoxemic in the 80s. WBC was 13.0, lactic acid 1.26. Chest x-ray showed bibasilar heterogeneous opacities likely atelectasis. She was started on intravenous steroids and placed on BiPAP. The patient recently followed up with Dr. Isaiah Serge on on 07/07/2015 and started on Daliresp.   Assessment/Plan: Acute on chronic respiratory failure with hypoxia and hypercarbia  -Secondary to COPD exacerbation  -check procalcitonin--<0.10 -Continue intravenous steroids--plan slow wean -Continue bronchodilators  -Wean oxygen to keep saturation between 88-92 percent  -presently stable on 3L when off BiPAP  COPD exacerbation/COPD GOLD IV -pulmonary hygiene -continue Daliresp -Home oxygen at 2 L  -add pulmicort -Duonebs q 4 hours -check procalcitonin <0.10 -continue levofloxacin for now-->change to po; today D# 3/5 -I feel that pt's anxiety is partly driving her pulmonary status -08/10/15-consulted pulmonary  Tobacco abuse  -Tobacco cessation discussed  -nicoderm patch 14 mg -restarted tobacco use during Christmas holiday  Atypical chest pain/Chronic chest pain -EKG--ST elevation V3-V6 without reciprocal changes -atypical in nature -Cycle  troponin--neg x 2 -consulted cardiology-->no further intervention; had previous cath 09/2014--nonobstructive -GI cocktail -start PPI--takes chronic ibuprofen at home  Anxiety/depression  -Continue Zoloft and Xanax  -pt appears reliable in terms of appropriate use of alprazolam -increase alprazolam to q 6hrs -lorazepam at hs prn anxiety  Chronic back pain -Patient tolerated intravenous fentanyl during her last hospitalization although she gave history of she claims to have had mild tongue/lip swelling-she never sought any medical attention. -tolerating morphine po presently--hesitate to escalate therapy in setting of respiratory failure -has tolerated IV fentanyl in past -IV toradol x 48 hours--she takes chronic ibuprofen at home  History of? Rheumatoid arthritis: Claims to have had arthritis-and was on Celebrex for a number of years while she was in [place]. Continue steroids/NSAIDs-will keep on low dose steroids till seen by rheumatology as outpatient  -monitor renal function on NSAID  Hypothyroidism  -Continue levothyroxine  -TSH--0.264 -continue present dose and recheck TSH in 4 weeks  Family Communication: Pt at beside Disposition Plan: remain in stepdown--still requiring BiPAP   Procedures/Studies: Dg Chest Port 1 View  08/08/2015  CLINICAL DATA:  Patient with cough and COPD. EXAM: PORTABLE CHEST 1 VIEW COMPARISON:  Chest radiograph 06/22/2015. FINDINGS: Stable cardiac and mediastinal contours. Minimal heterogeneous opacities bilateral lung bases. No large area of pulmonary consolidation. No pleural effusion or pneumothorax. IMPRESSION: Basilar heterogeneous opacities favored represent atelectasis. Electronically Signed   By: Annia Belt M.D.   On: 08/08/2015 14:38         Subjective: Patient states her breathing is much better than yesterday. Denies any nausea, vomiting, diarrhea, abdominal pain. Complains of epigastric and rib pain. Denies any fevers, chills,  hematochezia, melena.  Objective: Filed Vitals:   08/10/15 0315 08/10/15 0735 08/10/15 0748 08/10/15 0750  BP: 130/79 144/125  144/125  Pulse: 73 83  77  Temp: 98.4 F (36.9 C) 98.6 F (37 C)    TempSrc: Axillary Oral    Resp: 16 22  18   Height:      Weight: 57.38 kg (126 lb 8 oz)     SpO2: 98% 97% 99% 99%    Intake/Output Summary (Last 24 hours) at 08/10/15 0935 Last data filed at 08/10/15 0730  Gross per 24 hour  Intake    150 ml  Output   1500 ml  Net  -1350 ml   Weight change: 2.948 kg (6 lb 8 oz) Exam:   General:  Pt is alert, follows commands appropriately, not in acute distress  HEENT: No icterus, No thrush, No neck mass, [place]/AT  Cardiovascular: RRR, S1/S2, no rubs, no gallops  Respiratory: Basilar rales. No wheezing. Good air movement.  Abdomen: Soft/+BS, non tender, non distended, no guarding; no hepatosplenomegaly  Extremities: No edema, No lymphangitis, No petechiae, No rashes, no synovitis; no cyanosis  Data Reviewed: Basic Metabolic Panel:  Recent Labs Lab 08/08/15 1412 08/09/15 0255 08/10/15 0220  NA 134* 133* 133*  K 3.7 3.9 4.5  CL 91* 88* 90*  CO2 31 34* 35*  GLUCOSE 115* 121* 135*  BUN 11 12 21*  CREATININE 0.48 0.47 0.49  CALCIUM 9.1 9.0 9.2   Liver Function Tests: No results for input(s): AST, ALT, ALKPHOS, BILITOT, PROT, ALBUMIN in the last 168 hours. No results for input(s): LIPASE, AMYLASE in the last 168 hours. No results for input(s): AMMONIA in the last 168 hours. CBC:  Recent Labs Lab 08/08/15 1412 08/09/15 0255 08/10/15 0220  WBC 13.0* 7.1 10.5  NEUTROABS 12.2*  --   --   HGB 11.2* 9.8* 10.0*  HCT 33.7* 29.5* 30.1*  MCV 92.8 91.9 93.2  PLT 323 305 303   Cardiac Enzymes:  Recent Labs Lab 08/08/15 1412 08/09/15 1209  TROPONINI <0.03 <0.03   BNP: Invalid input(s): POCBNP CBG: No results for input(s): GLUCAP in the last 168 hours.  Recent Results (from the past 240 hour(s))  Culture, blood (routine x 2)      Status: None (Preliminary result)   Collection Time: 08/08/15  3:39 PM  Result Value Ref Range Status   Specimen Description BLOOD RIGHT HAND  Final   Special Requests BOTTLES DRAWN AEROBIC AND ANAEROBIC 5CC  Final   Culture NO GROWTH < 24 HOURS  Final   Report Status PENDING  Incomplete  Culture, blood (routine x 2)     Status: None (Preliminary result)   Collection Time: 08/08/15  3:44 PM  Result Value Ref Range Status   Specimen Description BLOOD LEFT HAND  Final   Special Requests BOTTLES DRAWN AEROBIC AND ANAEROBIC 5CC  Final   Culture NO GROWTH < 24 HOURS  Final   Report Status PENDING  Incomplete  MRSA PCR Screening     Status: None   Collection Time: 08/08/15  8:31 PM  Result Value Ref Range Status   MRSA by PCR NEGATIVE NEGATIVE Final    Comment:        The GeneXpert MRSA Assay (FDA approved for NASAL specimens only), is one component of a comprehensive MRSA colonization surveillance program. It is not intended to diagnose MRSA infection nor to guide or monitor treatment for MRSA infections.      Scheduled Meds: . aspirin EC  81 mg Oral Daily  . budesonide-formoterol  2 puff Inhalation BID  . enoxaparin (LOVENOX) injection  40 mg Subcutaneous Q24H  . furosemide  20 mg  Oral Daily  . ipratropium-albuterol  3 mL Nebulization 6 times per day  . ketorolac  15 mg Intravenous 3 times per day  . levofloxacin (LEVAQUIN) IV  750 mg Intravenous Q24H  . levothyroxine  50 mcg Oral Daily  . methylPREDNISolone (SOLU-MEDROL) injection  60 mg Intravenous Q6H  . nicotine  14 mg Transdermal Daily  . roflumilast  500 mcg Oral Daily  . sertraline  100 mg Oral Daily  . sodium chloride  3 mL Intravenous Q12H   Continuous Infusions:    Aydeen Blume, DO  [facility] Pager [phone]  If 7PM-7AM, please contact night-coverage [email or web address] Password [email or web address] 08/10/2015, 9:35 AM   LOS: 2 days

## 2015-08-10 NOTE — Progress Notes (Signed)
Patient Name: Terri Stephens Date of Encounter: 08/10/2015     Principal Problem:   Acute on chronic respiratory failure (HCC) Active Problems:   Hypothyroidism   TOBACCO ABUSE   COPD exacerbation (HCC)   CAP (community acquired pneumonia)   Depression with anxiety   Atypical chest pain   Abnormal EKG    SUBJECTIVE  Still short of breath. Complains of epigastic pain with coughing. No precordial pain. Rhythm is NSR at 79 bpm.  CURRENT MEDS . aspirin EC  81 mg Oral Daily  . budesonide-formoterol  2 puff Inhalation BID  . enoxaparin (LOVENOX) injection  40 mg Subcutaneous Q24H  . furosemide  20 mg Oral Daily  . ipratropium-albuterol  3 mL Nebulization 6 times per day  . ketorolac  15 mg Intravenous 3 times per day  . levofloxacin (LEVAQUIN) IV  750 mg Intravenous Q24H  . levothyroxine  50 mcg Oral Daily  . methylPREDNISolone (SOLU-MEDROL) injection  60 mg Intravenous Q6H  . nicotine  14 mg Transdermal Daily  . roflumilast  500 mcg Oral Daily  . sertraline  100 mg Oral Daily  . sodium chloride  3 mL Intravenous Q12H    OBJECTIVE  Filed Vitals:   08/10/15 0315 08/10/15 0735 08/10/15 0748 08/10/15 0750  BP: 130/79 144/125  144/125  Pulse: 73 83  77  Temp: 98.4 F (36.9 C) 98.6 F (37 C)    TempSrc: Axillary Oral    Resp: 16 22  18   Height:      Weight: 126 lb 8 oz (57.38 kg)     SpO2: 98% 97% 99% 99%    Intake/Output Summary (Last 24 hours) at 08/10/15 0808 Last data filed at 08/10/15 0000  Gross per 24 hour  Intake    150 ml  Output   1300 ml  Net  -1150 ml   Filed Weights   08/08/15 2031 08/09/15 0352 08/10/15 0315  Weight: 124 lb (56.246 kg) 124 lb 1.9 oz (56.3 kg) 126 lb 8 oz (57.38 kg)    PHYSICAL EXAM  General: Pleasant, NAD. Neuro: Alert and oriented X 3. Moves all extremities spontaneously. Psych: Normal affect. HEENT:  Normal  Neck: Supple without bruits or JVD. Lungs:  Resp regular and unlabored, CTA. No significant wheezing. Heart: RRR  no s3, s4, or murmurs. Abdomen: Soft, non-tender, non-distended, BS + x 4.  Extremities: No clubbing, cyanosis or edema. DP/PT/Radials 2+ and equal bilaterally.  Accessory Clinical Findings  CBC  Recent Labs  08/08/15 1412 08/09/15 0255 08/10/15 0220  WBC 13.0* 7.1 10.5  NEUTROABS 12.2*  --   --   HGB 11.2* 9.8* 10.0*  HCT 33.7* 29.5* 30.1*  MCV 92.8 91.9 93.2  PLT 323 305 303   Basic Metabolic Panel  Recent Labs  08/09/15 0255 08/10/15 0220  NA 133* 133*  K 3.9 4.5  CL 88* 90*  CO2 34* 35*  GLUCOSE 121* 135*  BUN 12 21*  CREATININE 0.47 0.49  CALCIUM 9.0 9.2   Liver Function Tests No results for input(s): AST, ALT, ALKPHOS, BILITOT, PROT, ALBUMIN in the last 72 hours. No results for input(s): LIPASE, AMYLASE in the last 72 hours. Cardiac Enzymes  Recent Labs  08/08/15 1412 08/09/15 1209  TROPONINI <0.03 <0.03   BNP Invalid input(s): POCBNP D-Dimer No results for input(s): DDIMER in the last 72 hours. Hemoglobin A1C No results for input(s): HGBA1C in the last 72 hours. Fasting Lipid Panel No results for input(s): CHOL, HDL, LDLCALC, TRIG, CHOLHDL, LDLDIRECT in  the last 72 hours. Thyroid Function Tests  Recent Labs  08/08/15 2058  TSH 0.264*    TELE  NSR  ECG    Radiology/Studies  Dg Chest Port 1 View  08/08/2015  CLINICAL DATA:  Patient with cough and COPD. EXAM: PORTABLE CHEST 1 VIEW COMPARISON:  Chest radiograph 06/22/2015. FINDINGS: Stable cardiac and mediastinal contours. Minimal heterogeneous opacities bilateral lung bases. No large area of pulmonary consolidation. No pleural effusion or pneumothorax. IMPRESSION: Basilar heterogeneous opacities favored represent atelectasis. Electronically Signed   By: Annia Belt M.D.   On: 08/08/2015 14:38    ASSESSMENT AND PLAN 1. Acute on chronic respiratory failure due to acute exacerbation of severe COPD - per IM.   2. Atypical chest pain and abnormal EKG -unchanged from prior EKGs. Troponins  normal x2. Normal cath in March 2016.  3. Hypothyroidism - abnormal TSH noted. Further adjustment of medication per internal medicine.  4. Tobacco abuse - counseled regarding cessation.  Plan: No further cardiology workup.Will sign off now . Please call for questions.  Signed, Ronny Flurry MD

## 2015-08-11 DIAGNOSIS — E43 Unspecified severe protein-calorie malnutrition: Secondary | ICD-10-CM

## 2015-08-11 DIAGNOSIS — J9622 Acute and chronic respiratory failure with hypercapnia: Secondary | ICD-10-CM

## 2015-08-11 DIAGNOSIS — R0789 Other chest pain: Secondary | ICD-10-CM

## 2015-08-11 DIAGNOSIS — J9620 Acute and chronic respiratory failure, unspecified whether with hypoxia or hypercapnia: Secondary | ICD-10-CM

## 2015-08-11 DIAGNOSIS — J189 Pneumonia, unspecified organism: Secondary | ICD-10-CM

## 2015-08-11 DIAGNOSIS — G8929 Other chronic pain: Secondary | ICD-10-CM

## 2015-08-11 LAB — BASIC METABOLIC PANEL
Anion gap: 8 (ref 5–15)
BUN: 19 mg/dL (ref 6–20)
CHLORIDE: 90 mmol/L — AB (ref 101–111)
CO2: 35 mmol/L — AB (ref 22–32)
Calcium: 9.4 mg/dL (ref 8.9–10.3)
Creatinine, Ser: 0.41 mg/dL — ABNORMAL LOW (ref 0.44–1.00)
GFR calc Af Amer: >60 mL/min (ref >60)
GFR calc non Af Amer: >60 mL/min (ref >60)
GLUCOSE: 172 mg/dL — AB (ref 65–99)
POTASSIUM: 4.1 mmol/L (ref 3.5–5.1)
Sodium: 133 mmol/L — ABNORMAL LOW (ref 135–145)

## 2015-08-11 LAB — CBC
HEMATOCRIT: 32.6 % — AB (ref 36.0–46.0)
Hemoglobin: 11.2 g/dL — ABNORMAL LOW (ref 12.0–15.0)
MCH: 31.8 pg (ref 26.0–34.0)
MCHC: 34.4 g/dL (ref 30.0–36.0)
MCV: 92.6 fL (ref 78.0–100.0)
Platelets: 274 10*3/uL (ref 150–400)
RBC: 3.52 MIL/uL — ABNORMAL LOW (ref 3.87–5.11)
RDW: 12.7 % (ref 11.5–15.5)
WBC: 12.8 10*3/uL — AB (ref 4.0–10.5)

## 2015-08-11 LAB — PROCALCITONIN: Procalcitonin: <0.1 ng/mL

## 2015-08-11 LAB — MAGNESIUM: Magnesium: 2.1 mg/dL (ref 1.7–2.4)

## 2015-08-11 MED ORDER — ALPRAZOLAM 0.5 MG PO TABS: 0.5000 mg | ORAL_TABLET | Freq: Four times a day (QID) | ORAL | Status: DC | PRN

## 2015-08-11 MED ORDER — IPRATROPIUM BROMIDE 0.02 % IN SOLN: 0.5000 mg | RESPIRATORY_TRACT | Status: DC | PRN

## 2015-08-11 MED ORDER — FLUTICASONE PROPIONATE 50 MCG/ACT NA SUSP
2.0000 | Freq: Every day | NASAL | Status: DC
  Administered 2015-08-13: 2 via NASAL
  Filled 2015-08-11 (×2): qty 16

## 2015-08-11 MED ORDER — MIDAZOLAM HCL 2 MG/2ML IJ SOLN: 2.0000 mg | INTRAMUSCULAR | Status: DC | PRN

## 2015-08-11 MED ORDER — GI COCKTAIL ~~LOC~~
30.0000 mL | Freq: Three times a day (TID) | ORAL | Status: DC | PRN
  Administered 2015-08-11: 30 mL via ORAL
  Filled 2015-08-11: qty 30

## 2015-08-11 MED ORDER — MORPHINE SULFATE 25 MG/ML IV SOLN
4.0000 mg/h | INTRAVENOUS | Status: DC
  Administered 2015-08-11 – 2015-08-13 (×2): 2 mg/h via INTRAVENOUS
  Administered 2015-08-15 – 2015-08-20 (×4): 4 mg/h via INTRAVENOUS
  Filled 2015-08-11 (×4): qty 10

## 2015-08-11 MED ORDER — BUDESONIDE 0.25 MG/2ML IN SUSP: 0.2500 mg | Freq: Two times a day (BID) | RESPIRATORY_TRACT | Status: DC

## 2015-08-11 MED ORDER — ENSURE ENLIVE PO LIQD
237.0000 mL | Freq: Two times a day (BID) | ORAL | Status: DC
  Administered 2015-08-11 – 2015-08-13 (×3): 237 mL via ORAL

## 2015-08-11 MED ORDER — IPRATROPIUM BROMIDE 0.02 % IN SOLN
0.5000 mg | RESPIRATORY_TRACT | Status: DC
  Administered 2015-08-11 – 2015-08-13 (×13): 0.5 mg via RESPIRATORY_TRACT
  Filled 2015-08-11 (×14): qty 2.5

## 2015-08-11 MED ORDER — LEVALBUTEROL HCL 0.63 MG/3ML IN NEBU
0.6300 mg | INHALATION_SOLUTION | RESPIRATORY_TRACT | Status: DC
  Administered 2015-08-11 – 2015-08-13 (×13): 0.63 mg via RESPIRATORY_TRACT
  Filled 2015-08-11 (×14): qty 3

## 2015-08-11 MED ORDER — MORPHINE BOLUS VIA INFUSION
2.0000 mg | INTRAVENOUS | Status: DC | PRN
  Administered 2015-08-11 – 2015-08-13 (×2): 2 mg via INTRAVENOUS
  Filled 2015-08-11 (×3): qty 2

## 2015-08-11 MED ORDER — BUDESONIDE-FORMOTEROL FUMARATE 160-4.5 MCG/ACT IN AERO
2.0000 | INHALATION_SPRAY | Freq: Two times a day (BID) | RESPIRATORY_TRACT | Status: DC
  Administered 2015-08-11 – 2015-08-13 (×4): 2 via RESPIRATORY_TRACT
  Filled 2015-08-11: qty 6

## 2015-08-11 MED ORDER — LORAZEPAM 1 MG PO TABS
1.0000 mg | ORAL_TABLET | Freq: Two times a day (BID) | ORAL | Status: DC
  Administered 2015-08-11 – 2015-08-13 (×4): 1 mg via ORAL
  Filled 2015-08-11 (×4): qty 1

## 2015-08-11 MED ORDER — LORATADINE 10 MG PO TABS
10.0000 mg | ORAL_TABLET | Freq: Every day | ORAL | Status: DC
  Administered 2015-08-11 – 2015-08-13 (×3): 10 mg via ORAL
  Filled 2015-08-11 (×4): qty 1

## 2015-08-11 MED ORDER — PANTOPRAZOLE SODIUM 40 MG PO TBEC
40.0000 mg | DELAYED_RELEASE_TABLET | Freq: Every day | ORAL | Status: DC
  Administered 2015-08-11 – 2015-08-13 (×2): 40 mg via ORAL
  Filled 2015-08-11 (×2): qty 1

## 2015-08-11 MED ORDER — LEVALBUTEROL HCL 0.63 MG/3ML IN NEBU: 0.6300 mg | INHALATION_SOLUTION | RESPIRATORY_TRACT | Status: DC | PRN

## 2015-08-11 MED ORDER — ALPRAZOLAM 0.5 MG PO TABS
0.5000 mg | ORAL_TABLET | Freq: Four times a day (QID) | ORAL | Status: DC | PRN
  Administered 2015-08-11 – 2015-08-12 (×3): 0.5 mg via ORAL
  Administered 2015-08-13: 1 mg via ORAL
  Filled 2015-08-11: qty 1
  Filled 2015-08-11: qty 2
  Filled 2015-08-11 (×2): qty 1

## 2015-08-11 NOTE — Consult Note (Signed)
Name: Terri Stephens MRN: [id number] DOB: 05-19-1956    ADMISSION DATE:  08/08/2015 CONSULTATION DATE:  08/10/15  REFERRING MD :  Tat  CHIEF COMPLAINT:  SOB  BRIEF PATIENT DESCRIPTION: 60 y.o. female with severe COPD (Gold IV and followed by Dr. Isaiah Serge), admitted to [facility] 01/08 with AECOPD.  Recently made DNR / DNI and referred to hospice.  PCCM called in consultation 01/10.  SIGNIFICANT EVENTS  07/06/15 > seen in office by Dr. Isaiah Serge, made DNR / DNI and referred to hospice at her request. 01/08 > admitted with AECOPD 01/10 > PCCM consulted.  STUDIES:  CXR 01/08 > atx.   SUBJECTIVE:  BIPAp prn  Xanax given  VITAL SIGNS: Temp:  [96.4 F (35.8 C)-99.2 F (37.3 C)] 98.8 F (37.1 C) (01/11 0736) Pulse Rate:  [64-122] 80 (01/11 1100) Resp:  [14-28] 17 (01/11 1100) BP: (125-177)/(76-93) 133/76 mmHg (01/11 1000) SpO2:  [94 %-100 %] 96 % (01/11 1100) FiO2 (%):  [40 %] 40 % (01/11 0350) Weight:  [56.1 kg (123 lb 10.9 oz)] 56.1 kg (123 lb 10.9 oz) (01/11 0317)  PHYSICAL EXAMINATION: General: Chronically ill appearing middle aged female, breathin fast on bipap Neuro: A&O x 3, non-focal. Anxious. HEENT:jvd up Cardiovascular: RRR, no M/R/G.  Lungs: poor air entry Abdomen: BS x 4, soft, NT/ND.  Musculoskeletal: No gross deformities, no edema.  Skin: Intact, warm, no rashes.     Recent Labs Lab 08/09/15 0255 08/10/15 0220 08/11/15 0945  NA 133* 133* 133*  K 3.9 4.5 4.1  CL 88* 90* 90*  CO2 34* 35* 35*  BUN 12 21* 19  CREATININE 0.47 0.49 0.41*  GLUCOSE 121* 135* 172*    Recent Labs Lab 08/09/15 0255 08/10/15 0220 08/11/15 0945  HGB 9.8* 10.0* 11.2*  HCT 29.5* 30.1* 32.6*  WBC 7.1 10.5 12.8*  PLT 305 303 274   No results found.  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 Isaiah Serge) - recently made DNR / DNI and referred to hospice. Tobacco use disorder - reportedly just started back over Christmas holidays. DNR / DNI  Status. She is still in distress and wishes COMFORT as we await her husband to arrive Plan: She does NOT want bipap anymore, she is"tired" and has accepted death I know her well from last admission  When she has a lot more spirit to survive She wants comfort i will call pall care myself to see if we can get her home hospice, goals have been decided Needs foley for comfort Supplemental O2 would not escalate past 6 liters will prolong suffering Continue BD's, IV steroids okay for now Change morphine to IV, likely need infusion, to rr 12-18 and pain She does not like pulmicort, change back to inhaler if able, symbicort To floor, will sig off  Mcarthur Rossetti. Tyson Alias, MD, FACP Pgr: [phone] [facility]

## 2015-08-11 NOTE — Progress Notes (Signed)
PT Cancellation Note  Patient Details Name: Terri Stephens MRN: [id number] DOB: [birth year]   Cancelled Treatment:    Reason Eval/Treat Not Completed: Patient declined, no reason specified; patient reports she is dying and doesn't see the point in PT at this time.  Will attempt again another day to ensure pt still feels the same.  Stayed with patient few minutes at her request due to not wanting to be alone.   Elray Mcgregor 08/11/2015, 12:32 PM  Sheran Lawless, PT [phone] 08/11/2015

## 2015-08-11 NOTE — Progress Notes (Signed)
[facility] PROGRESS NOTE  Terri Stephens JKK:[id number] DOB: 07-23-1956 DOA: 08/08/2015 PCP: Chilton Greathouse, MD  Brief History 60 year old female with history of end-stage COPD chronically on 2 Lcannula and prednisone 30 mg daily presented with a four-day history of progressive shortness of breath and increasing cough with sputum production. The patient was recently discharged from the hospital on 06/27/2015 after acute exacerbation of COPD. During that hospitalization, the patient required BiPAP. She was discharged with a slow prednisone taper. Prior to that hospitalization, the patient was hospitalized between November 3 through 06/06/2015. She denies any fevers, chills, chest pain, nausea, vomiting, diarrhea. Upon presentation, the patient was noted to be hypoxemic in the 80s. WBC was 13.0, lactic acid 1.26. Chest x-ray showed bibasilar heterogeneous opacities likely atelectasis. She was started on intravenous steroids and placed on BiPAP. The patient recently followed up with Dr. Isaiah Serge on on 07/07/2015 and started on Daliresp.    Assessment/Plan: #1 acute on chronic hypercarbic respiratory failure secondary to acute exacerbation of COPD with chronic O2 and steroid dependence/end-stage COPD Gold stage IV Patient in acute on chronic hypercarbic respiratory failure with use of accessory muscles of respiration patient with end-stage COPD and currently a DNR/DNI status. Patient has been seen by pulmonary and critical care medicine will recommend continuing ongoing current medical treatment with BiPAP as needed and revisiting hospice. Patient with no significant improvement with use of accessory muscles of respiration. Continue IV steroids, Pulmicort, schedule Xopenex and Atrovent nebulizers, Mucinex, daliresp, Levaquin. Will add Claritin and Flonase, PPI. Continue morphine when necessary and increase Xanax dose 2.5 mg every 6 hours when necessary. Patient with a poor prognosis and a such will  consult with palliative care for goals of care.  #2 tobacco abuse Tobacco cessation. Patient recently resumed tobacco use over the Christmas holiday. Continue nicotine patch.  #3 atypical chest pain/chronic chest pain EKG with ST elevation V3 through V6 without reciprocal changes. Atypical in nature. May be secondary to musculoskeletal secondary to coughing. Cardiac enzymes negative 2. Placed on a PPI patient on chronic ibuprofen at home. GI cocktail as needed. Patient had a previous cardiac catheterization March 2016 and was nonobstructive. Cardiology was consulted and no further intervention was recommended. Pain management.  #4 anxiety/depression Continue current regimen of Zoloft. Increase Xanax dose 2.5 mg every 6 hours as needed. Follow.  #5 chronic back pain IV Toradol 48 hours as patient on chronic ibuprofen at home. Morphine as needed.  #6 history of questionable rheumatoid arthritis Patient claims of had arthritis was on celebrex, a number of years while in [place]. Patient on IV steroids. Patient may need to be kept on low dose steroids until seen by rheumatology as outpatient.  #7 hypothyroidism TSH of 0.264. Continue current dose of Synthroid. Repeat thyroid function studies in about 4-6 weeks.  #8 severe protein calorie malnutrition Nutritional supplementation.  #9 prophylaxis Lovenox for DVT prophylaxis.  Code Status: DO NOT RESUSCITATE Family Communication: Updated patient. No family at bedside. Disposition Plan: Remain a stepdown as patient is requiring BiPAP.   Consultants:  PCCM: Dr Delton Coombes 08/10/2015  Cardiology: Dr. Patty Sermons 08/09/2015  Procedures:  Chest x-ray 08/08/2015  Antibiotics:  Levaquin 08/10/2015    HPI/Subjective: Patient states doesn't feel well. No change in SOB. Patient requesting anxiolytic dose be increased. Patient with some complaints of chest pain. Patient requesting a regular diet.   Objective: Filed Vitals:   08/11/15 0736  08/11/15 0800  BP: 126/78 146/78  Pulse: 74 79  Temp: 98.8 F (37.1 C)  Resp: 18 23    Intake/Output Summary (Last 24 hours) at 08/11/15 0946 Last data filed at 08/11/15 0840  Gross per 24 hour  Intake    180 ml  Output   1200 ml  Net  -1020 ml   Filed Weights   08/09/15 0352 08/10/15 0315 08/11/15 0317  Weight: 56.3 kg (124 lb 1.9 oz) 57.38 kg (126 lb 8 oz) 56.1 kg (123 lb 10.9 oz)    Exam:   General:   Sitting up in bedsitting up in bed using accessory pulses of respiration. Cachetic.  Cardiovascular: tachycardic  Respiratory: poor air movement.Tight.Use of accessory muscles of respiration.  Abdomen: Soft/NT/ND/+BS  Musculoskeletal: No c/c/e  Data Reviewed: Basic Metabolic Panel:  Recent Labs Lab 08/08/15 1412 08/09/15 0255 08/10/15 0220  NA 134* 133* 133*  K 3.7 3.9 4.5  CL 91* 88* 90*  CO2 31 34* 35*  GLUCOSE 115* 121* 135*  BUN 11 12 21*  CREATININE 0.48 0.47 0.49  CALCIUM 9.1 9.0 9.2   Liver Function Tests: No results for input(s): AST, ALT, ALKPHOS, BILITOT, PROT, ALBUMIN in the last 168 hours. No results for input(s): LIPASE, AMYLASE in the last 168 hours. No results for input(s): AMMONIA in the last 168 hours. CBC:  Recent Labs Lab 08/08/15 1412 08/09/15 0255 08/10/15 0220  WBC 13.0* 7.1 10.5  NEUTROABS 12.2*  --   --   HGB 11.2* 9.8* 10.0*  HCT 33.7* 29.5* 30.1*  MCV 92.8 91.9 93.2  PLT 323 305 303   Cardiac Enzymes:  Recent Labs Lab 08/08/15 1412 08/09/15 1209  TROPONINI <0.03 <0.03   BNP (last 3 results)  Recent Labs  06/03/15 1956 06/22/15 0553  BNP 57.1 94.8    ProBNP (last 3 results) No results for input(s): PROBNP in the last 8760 hours.  CBG: No results for input(s): GLUCAP in the last 168 hours.  Recent Results (from the past 240 hour(s))  Culture, blood (routine x 2)     Status: None (Preliminary result)   Collection Time: 08/08/15  3:39 PM  Result Value Ref Range Status   Specimen Description BLOOD  RIGHT HAND  Final   Special Requests BOTTLES DRAWN AEROBIC AND ANAEROBIC 5CC  Final   Culture NO GROWTH 2 DAYS  Final   Report Status PENDING  Incomplete  Culture, blood (routine x 2)     Status: None (Preliminary result)   Collection Time: 08/08/15  3:44 PM  Result Value Ref Range Status   Specimen Description BLOOD LEFT HAND  Final   Special Requests BOTTLES DRAWN AEROBIC AND ANAEROBIC 5CC  Final   Culture NO GROWTH 2 DAYS  Final   Report Status PENDING  Incomplete  MRSA PCR Screening     Status: None   Collection Time: 08/08/15  8:31 PM  Result Value Ref Range Status   MRSA by PCR NEGATIVE NEGATIVE Final    Comment:        The GeneXpert MRSA Assay (FDA approved for NASAL specimens only), is one component of a comprehensive MRSA colonization surveillance program. It is not intended to diagnose MRSA infection nor to guide or monitor treatment for MRSA infections.      Studies: No results found.  Scheduled Meds: . aspirin EC  81 mg Oral Daily  . budesonide (PULMICORT) nebulizer solution  0.5 mg Nebulization BID  . enoxaparin (LOVENOX) injection  40 mg Subcutaneous Q24H  . furosemide  20 mg Oral Daily  . guaiFENesin  1,200 mg Oral BID  .  ipratropium-albuterol  3 mL Nebulization 6 times per day  . levofloxacin  750 mg Oral Daily  . levothyroxine  50 mcg Oral Daily  . methylPREDNISolone (SOLU-MEDROL) injection  60 mg Intravenous Q6H  . nicotine  14 mg Transdermal Daily  . roflumilast  500 mcg Oral Daily  . sertraline  100 mg Oral Daily  . sodium chloride  3 mL Intravenous Q12H   Continuous Infusions:   Principal Problem:   Acute on chronic respiratory failure (HCC) Active Problems:   COPD exacerbation (HCC)   Hypothyroidism   TOBACCO ABUSE   Protein-calorie malnutrition, severe   CAP (community acquired pneumonia)   Depression with anxiety   Atypical chest pain   Abnormal EKG   End stage COPD (HCC)    Time spent: 40 mins    [facility] MD [facility] Pager [phone]. If 7PM-7AM, please contact night-coverage at [email or web address], password [facility] 08/11/2015, 9:46 AM  LOS: 3 days

## 2015-08-11 NOTE — Progress Notes (Addendum)
CSW notified this afternoon by unit RNCM that patient is now appropriate for residential hospice per MD.  Patient from home with husband; attempted to reach husband without success this afternoon. Will discuss with patient/family in the morning and initiate full referral to [facility].Palliative Care services to see patient.    Lorri Frederick. Jaci Lazier, [place] [phone]

## 2015-08-11 NOTE — Progress Notes (Signed)
Pharmacy Antibiotic Follow-up Note  Terri Stephens is a 60 y.o. year-old female admitted on 08/08/2015.  The patient is currently on day 4 of 5 of levofloxacin for COPD exacerbation with end-stage disease.  Assessment/Plan: After discussion with Dr. Arbutus Leas, the length of therapy for levofloxacin for COPD exacerbation is 5 days. The stop date is entered and will be 08/12/15.  Temp (24hrs), Avg:98.1 F (36.7 C), Min:96.4 F (35.8 C), Max:99.2 F (37.3 C)   Recent Labs Lab 08/08/15 1412 08/09/15 0255 08/10/15 0220  WBC 13.0* 7.1 10.5    Recent Labs Lab 08/08/15 1412 08/09/15 0255 08/10/15 0220  CREATININE 0.48 0.47 0.49   Estimated Creatinine Clearance: 68.7 mL/min (by C-G formula based on Cr of 0.49).    Allergies  Allergen Reactions  . Bee Venom Anaphylaxis  . Oxycodone Hcl Shortness Of Breath, Itching and Swelling    REACTION: All "codones" in any form    Antimicrobials this admission: 1/8 levofloxacin >>   Levels/dose changes this admission: none  Microbiology results: 1/8 BCx: NG x2D 1/8 MRSA PCR: negative  Thank you for allowing pharmacy to be a part of this patient's care. Given appropriate dose and stop dates entered, pharmacy will sign off at this time. Please reconsult if further assistance is needed.  Arcola Jansky, PharmD Clinical Pharmacy Resident Pager: [phone] 08/11/2015 8:06 AM

## 2015-08-12 LAB — BASIC METABOLIC PANEL
ANION GAP: 7 (ref 5–15)
BUN: 23 mg/dL — AB (ref 6–20)
CHLORIDE: 90 mmol/L — AB (ref 101–111)
CO2: 37 mmol/L — ABNORMAL HIGH (ref 22–32)
Calcium: 9 mg/dL (ref 8.9–10.3)
Creatinine, Ser: 0.48 mg/dL (ref 0.44–1.00)
GFR calc Af Amer: >60 mL/min (ref >60)
Glucose, Bld: 135 mg/dL — ABNORMAL HIGH (ref 65–99)
POTASSIUM: 4.7 mmol/L (ref 3.5–5.1)
SODIUM: 134 mmol/L — AB (ref 135–145)

## 2015-08-12 LAB — CBC
HEMATOCRIT: 30.4 % — AB (ref 36.0–46.0)
HEMOGLOBIN: 10.3 g/dL — AB (ref 12.0–15.0)
MCH: 31.6 pg (ref 26.0–34.0)
MCHC: 33.9 g/dL (ref 30.0–36.0)
MCV: 93.3 fL (ref 78.0–100.0)
Platelets: 277 10*3/uL (ref 150–400)
RBC: 3.26 MIL/uL — AB (ref 3.87–5.11)
RDW: 12.5 % (ref 11.5–15.5)
WBC: 13.5 10*3/uL — AB (ref 4.0–10.5)

## 2015-08-12 MED ORDER — BISACODYL 5 MG PO TBEC
10.0000 mg | DELAYED_RELEASE_TABLET | Freq: Every day | ORAL | Status: DC
  Administered 2015-08-13: 10 mg via ORAL
  Filled 2015-08-12: qty 2

## 2015-08-12 MED ORDER — SODIUM CHLORIDE 0.9 % IV SOLN
250.0000 mL | INTRAVENOUS | Status: DC | PRN
Start: 2015-08-12 — End: 2015-08-13
  Administered 2015-08-12: 500 mL via INTRAVENOUS
  Administered 2015-08-13: 07:00:00 via INTRAVENOUS

## 2015-08-12 MED ORDER — METHYLPREDNISOLONE SODIUM SUCC 125 MG IJ SOLR
60.0000 mg | INTRAMUSCULAR | Status: DC
  Administered 2015-08-14 – 2015-08-15 (×2): 60 mg via INTRAVENOUS
  Filled 2015-08-12 (×2): qty 2

## 2015-08-12 MED ORDER — DOCUSATE SODIUM 100 MG PO CAPS: 100.0000 mg | ORAL_CAPSULE | Freq: Two times a day (BID) | ORAL | Status: DC

## 2015-08-12 MED ORDER — SERTRALINE HCL 50 MG PO TABS
50.0000 mg | ORAL_TABLET | Freq: Every day | ORAL | Status: DC
  Administered 2015-08-13: 50 mg via ORAL
  Filled 2015-08-12: qty 1

## 2015-08-12 MED ORDER — OLANZAPINE 5 MG PO TABS
5.0000 mg | ORAL_TABLET | Freq: Every day | ORAL | Status: DC
  Administered 2015-08-12: 5 mg via ORAL
  Filled 2015-08-12 (×2): qty 1

## 2015-08-12 NOTE — Consult Note (Signed)
[facility] Liaison: Received request from CSW for patient interest in [facility]. Chart reviewed and will follow up with patient this morning. Patient was recently evaluated by [facility] for home hospice services. Note PMT consult pending. Will update CSW as soon as possible. Thank you.  Forrestine Him, LCSW [phone]

## 2015-08-12 NOTE — Clinical Social Work Note (Signed)
Clinical Social Work Assessment  Patient Details  Name: Terri Stephens MRN: [id number] Date of Birth: 1956-06-14  Date of referral:  08/12/15               Reason for consult:  End of Life/Hospice                Permission sought to share information with:    Permission granterd to share information::     Name::        Agency::     Relationship::     Contact Infeormation:     Housing/Transportation Living arrangements for the past 2 months:  Single Family Home Source of Information:  Patient Patient Interpreter Needed:  None Criminal Activity/Legal Involvement Pertinent to Current Situation/Hospitalization:  No - Comment as needed Significant Relatvves that his ability to care for her in her home is dependent on his ability to quit drinking.  Pt identifies no one else who can provide her with the care that she needs.  Pt has had initial eval provided to her by [place] and is agreeable to going home with hospice vs [facility] transfer.   Social Worker assessment / plan:  CSW met with pt to discuss d/c planning arrangements.  Pt unsure what she will need at d/c as she has "never died before."  Pt has been living alone in a home in [place] that belonged to her parents, while her husband has been in [place] taking care of their property/horses there.  Pt is expecting her husband to come to [place], but is concerned that he won't "throw away the bottle" in order to take care of her.  Pt is concerned about going home because she is "cannot walk" and needs 24 hr care and has limited support. Pt is not sure if she is appropriate to go to [facility] at this time, but would be agreeable to tx there.  CSW will continue to follow and assist with pt's d/c plan, as appropriate.   Employment status:  Disabled (Comment on whether or not currently receiving Disability) Insurance information:  Managed Care PT Recommendations:  No Follow Up Information / Referral to community resources:  Other (Comment  Required) ([facility])  Patient/Family's Response to care: Pt anxious, asking RN if it was "time for something."  Pt agreeable to hospice care at this time, stating that she was not ready for EOL care following her last admission.  Patient/Family's Understanding of and Emotional Response to Diagnosis, Current Treatment, and Prognosis: Pt aware that she is dying.  She was tearful and highly anxious during interview and concerned about her disposition.  Emotional Assessment Appearance:  Appears older than stated age Attitude/Demeanor/Rapport:  Angry Affect (typically observed):  Anxious Orientation:  Oriented to Self, Oriented to Place, Oriented to  Time, Oriented to Situation Alcohol / Substance use:  Tobacco Use Psych involvement (Current and /or in the community):  No (Comment)  Discharge Needs  Concerns to be addressed:  Grief and Loss Concerns, Adjustment to Illness, Coping/Stress Concerns, Discharge Planning Concerns Readmission within the last 30 days:  No Current discharge risk:  Terminally ill, Dependent with Mobility Barriers to Discharge:  No Barriers Identified   Kyheem Bathgate M, LCSW 08/12/2015, 12:48 AM

## 2015-08-12 NOTE — Progress Notes (Signed)
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 The patient was recently discharged from the hospital on 06/27/2015 after acute exacerbation of COPD. During that hospitalization, the patient required BiPAP. She was discharged with a slow prednisone taper. Prior to that hospitalization, the patient was hospitalized between November 3 through 06/06/2015. She denies any fevers, chills, chest pain, nausea, vomiting, diarrhea. Upon presentation, the patient was noted to be hypoxemic in the 80s. WBC was 13.0, lactic acid 1.26. Chest x-ray showed bibasilar heterogeneous opacities likely atelectasis. She was started on intravenous steroids and placed on BiPAP. The patient recently followed up with Dr. Isaiah Serge on on 07/07/2015 and started on Daliresp.    Assessment/Plan: #1 acute on chronic hypercarbic respiratory failure secondary to acute exacerbation of COPD with chronic O2 and steroid dependence/end-stage COPD Gold stage IV Patient in acute on chronic hypercarbic respiratory failure with use of accessory muscles of respiration patient with end-stage COPD and currently a DNR/DNI status. Patient has been seen by pulmonary and critical care medicine will recommend continuing ongoing current medical treatment with BiPAP as needed and revisiting hospice. Patient with some improvement with use of accessory muscles of respiration after being started on morphine gtt. Continue IV steroids, symbicort, schedule Xopenex and Atrovent nebulizers, Mucinex, daliresp, Levaquin, Claritin and Flonase, PPI. Continue scheduled ativan and prn xanax. Patient with a poor prognosis and doesnot want BIPAP. BIPAP has been d/c'd by  pulmonary. Patient wishes for comfort. Palliative care has been consulted for goals of care and probable hospice referral.  #2 tobacco abuse Tobacco cessation. Patient recently resumed tobacco use over the Christmas holiday. Continue nicotine patch.  #3 atypical chest pain/chronic chest pain EKG with ST elevation V3 through V6 without reciprocal changes. Atypical in nature. May be secondary to musculoskeletal secondary to coughing. Cardiac enzymes negative 2. Placed on a PPI patient on chronic ibuprofen at home. GI cocktail as needed. Patient had a previous cardiac catheterization March 2016 and was nonobstructive. Cardiology was consulted and no further intervention was recommended. Pain management.  #4 anxiety/depression Continue current regimen of Zoloft. Continue current Xanax dose prn and scheduled ativan. Follow.  #5 chronic back pain Patient on morphine gtt.   #6 history of questionable rheumatoid arthritis Patient claims of had arthritis was on celebrex, a number of years while in [place]. Patient on IV steroids. Patient may need to be kept on low dose steroids until seen by rheumatology as outpatient.  #7 hypothyroidism TSH of 0.264. Continue current dose of Synthroid. Repeat thyroid function studies in about 4-6 weeks.  #8 severe protein calorie malnutrition Nutritional supplementation.  #9 prophylaxis Lovenox for DVT prophylaxis.  Code Status: DO NOT RESUSCITATE Family Communication: Updated patient and husband at bedside. Disposition Plan: Remain a stepdown pending palliative care evaluation.   Consultants:  PCCM: Dr Delton Coombes 08/10/2015  Cardiology: Dr. Patty Sermons 08/09/2015  Procedures:  Chest x-ray 08/08/2015  Antibiotics:  Levaquin 08/10/2015    HPI/Subjective: Patient states less SOB on morphine gtt. Patient states just coughed while eating and may have aspirated.  Objective: Filed Vitals:   08/12/15 0600 08/12/15 0722  BP: 128/85 140/85  Pulse: 72 74   Temp:  97.5 F (36.4 C)  Resp: 13 19    Intake/Output Summary (Last 24 hours) at 08/12/15 0944  Last data filed at 08/12/15 0700  Gross per 24 hour  Intake 517.07 ml  Output   1360 ml  Net -842.93 ml   Filed Weights   08/10/15 0315 08/11/15 0317 08/12/15 0400  Weight: 57.38 kg (126 lb 8 oz) 56.1 kg (123 lb 10.9 oz) 56.79 kg (125 lb 3.2 oz)    Exam:   General:   Sitting up in bedsitting up in bed. Less use of accessory pulses of respiration. Cachetic.  Cardiovascular: RRR  Respiratory: poor air movement. Less Tight. Less Use of accessory muscles of respiration.Expiratory and inspiratory wheezes.  Abdomen: Soft/NT/ND/+BS  Musculoskeletal: No c/c/e  Data Reviewed: Basic Metabolic Panel:  Recent Labs Lab 08/08/15 1412 08/09/15 0255 08/10/15 0220 08/11/15 0945 08/12/15 0203  NA 134* 133* 133* 133* 134*  K 3.7 3.9 4.5 4.1 4.7  CL 91* 88* 90* 90* 90*  CO2 31 34* 35* 35* 37*  GLUCOSE 115* 121* 135* 172* 135*  BUN 11 12 21* 19 23*  CREATININE 0.48 0.47 0.49 0.41* 0.48  CALCIUM 9.1 9.0 9.2 9.4 9.0  MG  --   --   --  2.1  --    Liver Function Tests: No results for input(s): AST, ALT, ALKPHOS, BILITOT, PROT, ALBUMIN in the last 168 hours. No results for input(s): LIPASE, AMYLASE in the last 168 hours. No results for input(s): AMMONIA in the last 168 hours. CBC:  Recent Labs Lab 08/08/15 1412 08/09/15 0255 08/10/15 0220 08/11/15 0945 08/12/15 0203  WBC 13.0* 7.1 10.5 12.8* 13.5*  NEUTROABS 12.2*  --   --   --   --   HGB 11.2* 9.8* 10.0* 11.2* 10.3*  HCT 33.7* 29.5* 30.1* 32.6* 30.4*  MCV 92.8 91.9 93.2 92.6 93.3  PLT 323 305 303 274 277   Cardiac Enzymes:  Recent Labs Lab 08/08/15 1412 08/09/15 1209  TROPONINI <0.03 <0.03   BNP (last 3 results)  Recent Labs  06/03/15 1956 06/22/15 0553  BNP 57.1 94.8    ProBNP (last 3 results) No results for input(s): PROBNP in the last 8760 hours.  CBG: No results for input(s): GLUCAP in the last 168  hours.  Recent Results (from the past 240 hour(s))  Culture, blood (routine x 2)     Status: None (Preliminary result)   Collection Time: 08/08/15  3:39 PM  Result Value Ref Range Status   Specimen Description BLOOD RIGHT HAND  Final   Special Requests BOTTLES DRAWN AEROBIC AND ANAEROBIC 5CC  Final   Culture NO GROWTH 3 DAYS  Final   Report Status PENDING  Incomplete  Culture, blood (routine x 2)     Status: None (Preliminary result)   Collection Time: 08/08/15  3:44 PM  Result Value Ref Range Status   Specimen Description BLOOD LEFT HAND  Final   Special Requests BOTTLES DRAWN AEROBIC AND ANAEROBIC 5CC  Final   Culture NO GROWTH 3 DAYS  Final   Report Status PENDING  Incomplete  MRSA PCR Screening     Status: None   Collection Time: 08/08/15  8:31 PM  Result Value Ref Range Status   MRSA by PCR NEGATIVE NEGATIVE Final    Comment:        The GeneXpert MRSA Assay (FDA approved for NASAL specimens only), is one component of a comprehensive MRSA colonization surveillance program. It is not intended to diagnose MRSA infection nor to guide or monitor treatment for MRSA infections.      Studies: No results found.  Scheduled Meds: .  aspirin EC  81 mg Oral Daily  . budesonide-formoterol  2 puff Inhalation BID  . enoxaparin (LOVENOX) injection  40 mg Subcutaneous Q24H  . feeding supplement (ENSURE ENLIVE)  237 mL Oral BID BM  . fluticasone  2 spray Each Nare Daily  . furosemide  20 mg Oral Daily  . guaiFENesin  1,200 mg Oral BID  . ipratropium  0.5 mg Nebulization Q4H  . levalbuterol  0.63 mg Nebulization Q4H  . levofloxacin  750 mg Oral Daily  . levothyroxine  50 mcg Oral Daily  . loratadine  10 mg Oral Daily  . LORazepam  1 mg Oral BID  . methylPREDNISolone (SOLU-MEDROL) injection  60 mg Intravenous Q6H  . nicotine  14 mg Transdermal Daily  . pantoprazole  40 mg Oral Q0600  . roflumilast  500 mcg Oral Daily  . sertraline  100 mg Oral Daily  . sodium chloride  3 mL  Intravenous Q12H   Continuous Infusions: . morphine 2 mg/hr (08/11/15 1952)    Principal Problem:   Acute on chronic respiratory failure (HCC) Active Problems:   COPD exacerbation (HCC)   Hypothyroidism   TOBACCO ABUSE   Protein-calorie malnutrition, severe   CAP (community acquired pneumonia)   Depression with anxiety   Atypical chest pain   Abnormal EKG   End stage COPD (HCC)   Chronic pain    Time spent: 40 mins    [facility] MD [facility] Pager [phone]. If 7PM-7AM, please contact night-coverage at [email or web address], password [facility] 08/12/2015, 9:44 AM  LOS: 4 days

## 2015-08-12 NOTE — Progress Notes (Signed)
Patient is agreeable to [facility] referral.  Notified Forrestine Him, LCSW- [facility] Liaison for follow up to determine if she is appropriate for residential hospice placement. Lorri Frederick. Jaci Lazier, [place] [phone]

## 2015-08-12 NOTE — Progress Notes (Signed)
I met with Terri Stephens this evening after receiving a call from Dr. Titus Mould requesting assistance getting her into hospice care - home or residential. Dr. Titus Mould had started her on a morphine infusion earlier today and discussed her goals of care. He felt strongly that she was approaching EOL. On my visit she was greatly improved from an anxiety and dyspnea standpoint on the morphine infusion. She was alert, talkative and optimistic about how much better she was feeling. Her oxygen saturations were normal and she was not experiencing distress.   Based on my assessment of her this evening I recommend determining her 24 hour morphine needs via pump/infusion basal and bolus doses and then transitioning that to a long acting oral morphine. I also added on PRN alprazolam for anxiety which is an ongoing problem for her. She was not ready to discuss anything more than her symptom management this evening- she feared getting emotionally upset and was processing prognostic information received earlier in the day. We saw her in consultation 06/2015 at which time she was open to hospice care and referrals were made and followed through Dr. Vaughan Browner. Unclear why hospice services fell through excerpt for her reporting that she "had a bad experience with hospice" - The home situation may not be safe for her. She also has chronic pain issues that have been difficult to manage in the outpatient setting.   She is home hospice appropriate unless she declines further at which point I would recommend a hospice facility. Will see her in FU on 1/12 and determine best recommendation.   Lane Hacker, DO Palliative Medicine [phone]

## 2015-08-13 ENCOUNTER — Telehealth: Payer: Self-pay | Admitting: Pulmonary Disease

## 2015-08-13 DIAGNOSIS — Z515 Encounter for palliative care: Secondary | ICD-10-CM

## 2015-08-13 LAB — CULTURE, BLOOD (ROUTINE X 2)
Culture: NO GROWTH
Culture: NO GROWTH

## 2015-08-13 LAB — BASIC METABOLIC PANEL
ANION GAP: 6 (ref 5–15)
BUN: 27 mg/dL — ABNORMAL HIGH (ref 6–20)
CALCIUM: 9 mg/dL (ref 8.9–10.3)
CO2: 39 mmol/L — AB (ref 22–32)
Chloride: 92 mmol/L — ABNORMAL LOW (ref 101–111)
Creatinine, Ser: 0.54 mg/dL (ref 0.44–1.00)
GLUCOSE: 119 mg/dL — AB (ref 65–99)
POTASSIUM: 4.1 mmol/L (ref 3.5–5.1)
Sodium: 137 mmol/L (ref 135–145)

## 2015-08-13 LAB — PROCALCITONIN: Procalcitonin: <0.1 ng/mL

## 2015-08-13 MED ORDER — HALOPERIDOL 1 MG PO TABS
0.5000 mg | ORAL_TABLET | ORAL | Status: DC | PRN
  Filled 2015-08-13: qty 1

## 2015-08-13 MED ORDER — ACETAMINOPHEN 325 MG PO TABS: 650.0000 mg | ORAL_TABLET | Freq: Four times a day (QID) | ORAL | Status: DC | PRN

## 2015-08-13 MED ORDER — MORPHINE BOLUS VIA INFUSION
4.0000 mg | INTRAVENOUS | Status: DC | PRN
  Administered 2015-08-18 – 2015-08-19 (×5): 4 mg via INTRAVENOUS
  Filled 2015-08-13 (×6): qty 4

## 2015-08-13 MED ORDER — SODIUM CHLORIDE 0.9 % IV SOLN
1.0000 mg/h | INTRAVENOUS | Status: DC
  Administered 2015-08-13 – 2015-08-20 (×5): 1 mg/h via INTRAVENOUS
  Filled 2015-08-13 (×4): qty 10

## 2015-08-13 MED ORDER — HALOPERIDOL LACTATE 5 MG/ML IJ SOLN
0.5000 mg | INTRAMUSCULAR | Status: DC | PRN
  Administered 2015-08-15 – 2015-08-20 (×3): 0.5 mg via INTRAVENOUS
  Filled 2015-08-13 (×3): qty 1

## 2015-08-13 MED ORDER — ONDANSETRON HCL 4 MG/2ML IJ SOLN: 4.0000 mg | Freq: Four times a day (QID) | INTRAMUSCULAR | Status: DC | PRN

## 2015-08-13 MED ORDER — CETYLPYRIDINIUM CHLORIDE 0.05 % MT LIQD
7.0000 mL | Freq: Two times a day (BID) | OROMUCOSAL | Status: DC
  Administered 2015-08-13 – 2015-08-20 (×13): 7 mL via OROMUCOSAL

## 2015-08-13 MED ORDER — LEVALBUTEROL HCL 0.63 MG/3ML IN NEBU
0.6300 mg | INHALATION_SOLUTION | RESPIRATORY_TRACT | Status: DC | PRN
  Administered 2015-08-16 – 2015-08-20 (×4): 0.63 mg via RESPIRATORY_TRACT
  Filled 2015-08-13 (×4): qty 3

## 2015-08-13 MED ORDER — DILTIAZEM LOAD VIA INFUSION
10.0000 mg | Freq: Once | INTRAVENOUS | Status: DC
  Filled 2015-08-13: qty 10

## 2015-08-13 MED ORDER — GLYCOPYRROLATE 0.2 MG/ML IJ SOLN
0.2000 mg | INTRAMUSCULAR | Status: DC | PRN
  Filled 2015-08-13: qty 1

## 2015-08-13 MED ORDER — DILTIAZEM HCL 100 MG IV SOLR
5.0000 mg/h | INTRAVENOUS | Status: DC
  Filled 2015-08-13: qty 100

## 2015-08-13 MED ORDER — BIOTENE DRY MOUTH MT LIQD: 15.0000 mL | OROMUCOSAL | Status: DC | PRN

## 2015-08-13 MED ORDER — POLYVINYL ALCOHOL 1.4 % OP SOLN
1.0000 [drp] | Freq: Four times a day (QID) | OPHTHALMIC | Status: DC | PRN
  Filled 2015-08-13: qty 15

## 2015-08-13 MED ORDER — MIDAZOLAM BOLUS VIA INFUSION
2.0000 mg | INTRAVENOUS | Status: DC | PRN
  Administered 2015-08-15: 2 mg via INTRAVENOUS
  Filled 2015-08-13 (×2): qty 2

## 2015-08-13 MED ORDER — MIDAZOLAM HCL 2 MG/2ML IJ SOLN
INTRAMUSCULAR | Status: AC
  Administered 2015-08-13: 2 mg
  Filled 2015-08-13: qty 2

## 2015-08-13 MED ORDER — ACETAMINOPHEN 650 MG RE SUPP: 650.0000 mg | Freq: Four times a day (QID) | RECTAL | Status: DC | PRN

## 2015-08-13 MED ORDER — GLYCOPYRROLATE 1 MG PO TABS
1.0000 mg | ORAL_TABLET | ORAL | Status: DC | PRN
  Filled 2015-08-13: qty 1

## 2015-08-13 MED ORDER — IPRATROPIUM BROMIDE 0.02 % IN SOLN: 0.5000 mg | RESPIRATORY_TRACT | Status: DC | PRN

## 2015-08-13 MED ORDER — ONDANSETRON 4 MG PO TBDP: 4.0000 mg | ORAL_TABLET | Freq: Four times a day (QID) | ORAL | Status: DC | PRN

## 2015-08-13 MED ORDER — HALOPERIDOL LACTATE 2 MG/ML PO CONC
0.5000 mg | ORAL | Status: DC | PRN
  Filled 2015-08-13: qty 0.3

## 2015-08-13 MED ORDER — SODIUM CHLORIDE 0.9 % IV SOLN: 250.0000 mL | INTRAVENOUS | Status: DC | PRN

## 2015-08-13 NOTE — Progress Notes (Addendum)
Terri Stephens has taken a dramatic turn this afternoon. Diffuse Rhonchi. Body is rigid. She is increasingly confused, now in Afib with RVR HR 190's, severe anxiety and distress, chest pain and terminal agitation beginning. Not able to maintain her O2 saturations. Respirtory rates in the 40's. I discussed with Terri Stephens at bedside -also with Terri Stephens. Goals are full comfort. I anticipate a hospital death. She is requiring Morphine and Versed infusion for comfort and palliative sedation at this point. Death is immanent- no diltiazem or escalation of medical interventions-this heart rhythm is terminal.  EOL orders placed.  Time: 3PM-3:25PM Total 25 minutes Greater than 50%  of this time was spent counseling and coordinating care related to the above assessment and plan.   Terri Malta, DO Palliative Medicine [phone]

## 2015-08-13 NOTE — Telephone Encounter (Signed)
Spoke with JJ and she will discuss with Dr. Isaiah Serge

## 2015-08-13 NOTE — Progress Notes (Signed)
Wasted of 1mg /62mL morphine in sink. Witnessed by 0m, RN

## 2015-08-13 NOTE — Progress Notes (Signed)
Per md notes may need home hospice vs hospice medical facility. Carley Hammed w [facility] has seen pt. Pt was eval by hospice and [facility] [place] last month but felt she did not need them. According to notes now she has less than 4 weeks prognosis. Have alerted [facility] [place] again.

## 2015-08-13 NOTE — Progress Notes (Signed)
Pt in afib RVR. Palliative MD at bedside. New orders given. Comfort measures in place. Will continue to monitor. Family at bedside.

## 2015-08-13 NOTE — Progress Notes (Signed)
[facility] PROGRESS NOTE  Khole Arterburn AYT:[id number] DOB: January 20, 1956 DOA: 08/08/2015 PCP: Chilton Greathouse, MD  Brief History 60 year old female with history of end-stage COPD chronically on 2 Lcannula and prednisone 30 mg daily presented with a four-day history of progressive shortness of breath and increasing cough with sputum production. The patient was recently discharged from the hospital on 06/27/2015 after acute exacerbation of COPD. During that hospitalization, the patient required BiPAP. She was discharged with a slow prednisone taper. Prior to that hospitalization, the patient was hospitalized between November 3 through 06/06/2015. She denies any fevers, chills, chest pain, nausea, vomiting, diarrhea. Upon presentation, the patient was noted to be hypoxemic in the 80s. WBC was 13.0, lactic acid 1.26. Chest x-ray showed bibasilar heterogeneous opacities likely atelectasis. She was started on intravenous steroids and placed on BiPAP. The patient recently followed up with Dr. Isaiah Serge on on 07/07/2015 and started on Daliresp.    Assessment/Plan: #1 acute on chronic hypercarbic respiratory failure secondary to acute exacerbation of COPD with chronic O2 and steroid dependence/end-stage COPD Gold stage IV Patient in acute on chronic hypercarbic respiratory failure with less use of accessory muscles of respiration patient and less air hunger now, with end-stage COPD and currently a DNR/DNI status. Patient has been seen by pulmonary and critical care medicine will recommend continuing ongoing current medical treatment with BiPAP as needed and revisiting hospice. Patient with some improvement with less use of accessory muscles of respiration after being started on morphine gtt. Continue IV steroids, symbicort, schedule Xopenex and Atrovent nebulizers, Mucinex, daliresp, Levaquin, Claritin and Flonase, PPI. Continue scheduled ativan and prn xanax, zyprexa. Patient with a poor prognosis and doesnot  want BIPAP. BIPAP has been d/c'd by pulmonary. Patient wishes for comfort. Palliative care has been consulted for goals of care and probable hospice referral. Palliative care ff abnd in discussions with patient and family.  #2 tobacco abuse Tobacco cessation. Patient recently resumed tobacco use over the Christmas holiday. Continue nicotine patch.  #3 atypical chest pain/chronic chest pain EKG with ST elevation V3 through V6 without reciprocal changes. Atypical in nature. May be secondary to musculoskeletal secondary to coughing. Cardiac enzymes negative 2. Placed on a PPI patient on chronic ibuprofen at home. GI cocktail as needed. Patient had a previous cardiac catheterization March 2016 and was nonobstructive. Cardiology was consulted and no further intervention was recommended. Pain management.  #4 anxiety/depression Continue current regimen of Zoloft and zyprexa. Continue current Xanax dose prn and scheduled ativan. Follow.  #5 chronic back pain Patient on morphine gtt.   #6 history of questionable rheumatoid arthritis Patient claims of had arthritis was on celebrex, a number of years while in [place]. Patient on IV steroids. Patient may need to be kept on low dose steroids until seen by rheumatology as outpatient.  #7 hypothyroidism TSH of 0.264. Continue current dose of Synthroid. Repeat thyroid function studies in about 4-6 weeks.  #8 severe protein calorie malnutrition Nutritional supplementation.  #9 prophylaxis Lovenox for DVT prophylaxis.  Code Status: DO NOT RESUSCITATE Family Communication: Updated patient and husband at bedside. Disposition Plan: Transfer to [facility]. Likely hospice home vs home with hospice pending palliative care.   Consultants:  PCCM: Dr Delton Coombes 08/10/2015  Cardiology: Dr. Patty Sermons 08/09/2015  Palliative Care Dr Phillips Odor 08/11/2015  Procedures:  Chest x-ray 08/08/2015  Antibiotics:  Levaquin 08/10/2015    HPI/Subjective: Patient less  SOB on morphine gtt. Patient still with CP on ispiration and coughing however improving.  Objective: Filed Vitals:   08/13/15 0809 08/13/15  0909  BP: 117/75 117/75  Pulse: 71 79  Temp: 98.6 F (37 C)   Resp: 17 13    Intake/Output Summary (Last 24 hours) at 08/13/15 0959 Last data filed at 08/13/15 5188  Gross per 24 hour  Intake 1727.5 ml  Output   1580 ml  Net  147.5 ml   Filed Weights   08/11/15 0317 08/12/15 0400 08/13/15 0500  Weight: 56.1 kg (123 lb 10.9 oz) 56.79 kg (125 lb 3.2 oz) 56.2 kg (123 lb 14.4 oz)    Exam:   General:   Sitting up in bed. Less use of accessory pulses of respiration. Cachetic.  Cardiovascular: RRR  Respiratory: poor air movement. Less Tight. Less Use of accessory muscles of respiration. Minimal Expiratory and inspiratory wheezes.  Abdomen: Soft/NT/ND/+BS  Musculoskeletal: No c/c/e  Data Reviewed: Basic Metabolic Panel:  Recent Labs Lab 08/09/15 0255 08/10/15 0220 08/11/15 0945 08/12/15 0203 08/13/15 0243  NA 133* 133* 133* 134* 137  K 3.9 4.5 4.1 4.7 4.1  CL 88* 90* 90* 90* 92*  CO2 34* 35* 35* 37* 39*  GLUCOSE 121* 135* 172* 135* 119*  BUN 12 21* 19 23* 27*  CREATININE 0.47 0.49 0.41* 0.48 0.54  CALCIUM 9.0 9.2 9.4 9.0 9.0  MG  --   --  2.1  --   --    Liver Function Tests: No results for input(s): AST, ALT, ALKPHOS, BILITOT, PROT, ALBUMIN in the last 168 hours. No results for input(s): LIPASE, AMYLASE in the last 168 hours. No results for input(s): AMMONIA in the last 168 hours. CBC:  Recent Labs Lab 08/08/15 1412 08/09/15 0255 08/10/15 0220 08/11/15 0945 08/12/15 0203  WBC 13.0* 7.1 10.5 12.8* 13.5*  NEUTROABS 12.2*  --   --   --   --   HGB 11.2* 9.8* 10.0* 11.2* 10.3*  HCT 33.7* 29.5* 30.1* 32.6* 30.4*  MCV 92.8 91.9 93.2 92.6 93.3  PLT 323 305 303 274 277   Cardiac Enzymes:  Recent Labs Lab 08/08/15 1412 08/09/15 1209  TROPONINI <0.03 <0.03   BNP (last 3 results)  Recent Labs  06/03/15 1956  06/22/15 0553  BNP 57.1 94.8    ProBNP (last 3 results) No results for input(s): PROBNP in the last 8760 hours.  CBG: No results for input(s): GLUCAP in the last 168 hours.  Recent Results (from the past 240 hour(s))  Culture, blood (routine x 2)     Status: None (Preliminary result)   Collection Time: 08/08/15  3:39 PM  Result Value Ref Range Status   Specimen Description BLOOD RIGHT HAND  Final   Special Requests BOTTLES DRAWN AEROBIC AND ANAEROBIC 5CC  Final   Culture NO GROWTH 4 DAYS  Final   Report Status PENDING  Incomplete  Culture, blood (routine x 2)     Status: None (Preliminary result)   Collection Time: 08/08/15  3:44 PM  Result Value Ref Range Status   Specimen Description BLOOD LEFT HAND  Final   Special Requests BOTTLES DRAWN AEROBIC AND ANAEROBIC 5CC  Final   Culture NO GROWTH 4 DAYS  Final   Report Status PENDING  Incomplete  MRSA PCR Screening     Status: None   Collection Time: 08/08/15  8:31 PM  Result Value Ref Range Status   MRSA by PCR NEGATIVE NEGATIVE Final    Comment:        The GeneXpert MRSA Assay (FDA approved for NASAL specimens only), is one component of a comprehensive MRSA colonization surveillance  program. It is not intended to diagnose MRSA infection nor to guide or monitor treatment for MRSA infections.      Studies: No results found.  Scheduled Meds: . antiseptic oral rinse  7 mL Mouth Rinse BID  . aspirin EC  81 mg Oral Daily  . bisacodyl  10 mg Oral Daily  . budesonide-formoterol  2 puff Inhalation BID  . enoxaparin (LOVENOX) injection  40 mg Subcutaneous Q24H  . feeding supplement (ENSURE ENLIVE)  237 mL Oral BID BM  . fluticasone  2 spray Each Nare Daily  . furosemide  20 mg Oral Daily  . guaiFENesin  1,200 mg Oral BID  . ipratropium  0.5 mg Nebulization Q4H  . levalbuterol  0.63 mg Nebulization Q4H  . levothyroxine  50 mcg Oral Daily  . loratadine  10 mg Oral Daily  . LORazepam  1 mg Oral BID  . methylPREDNISolone  (SOLU-MEDROL) injection  60 mg Intravenous Q24H  . nicotine  14 mg Transdermal Daily  . OLANZapine  5 mg Oral QHS  . pantoprazole  40 mg Oral Q0600  . roflumilast  500 mcg Oral Daily  . sertraline  50 mg Oral Daily  . sodium chloride  3 mL Intravenous Q12H   Continuous Infusions: . morphine 2 mg/hr (08/13/15 7001)    Principal Problem:   Acute on chronic respiratory failure (HCC) Active Problems:   COPD exacerbation (HCC)   Hypothyroidism   TOBACCO ABUSE   Protein-calorie malnutrition, severe   CAP (community acquired pneumonia)   Depression with anxiety   Atypical chest pain   Abnormal EKG   End stage COPD (HCC)   Chronic pain    Time spent: 40 mins    [facility] MD [facility] Pager [phone]. If 7PM-7AM, please contact night-coverage at [email or web address], password [facility] 08/13/2015, 9:59 AM  LOS: 5 days

## 2015-08-13 NOTE — Telephone Encounter (Signed)
Called Hospice and spoke with Chip Boer who reported that pt has declined since leaving this message and it is no longer needed for PM to be the attending Nothing further needed; will sign off

## 2015-08-13 NOTE — Progress Notes (Signed)
Per family's request, only respiratory and IV assessment were completed. Pt's family also refused q2h pt turning, will let RN know if they want pt turned.

## 2015-08-13 NOTE — Progress Notes (Addendum)
Met with patient, her Blanchard Kelch, her husband and grandaughter. Patient and family in a much better place of acceptance this evening and thinking about next steps. She is hesitant about the switch from IV to oral long acting Morphine. Patient really wants to go home at least one more time, but I am concerned about her symptom burden-if she wants to maintain IV morphine then we will need to place a PICC line. Anxiety and air hunger remain a problem- frequent PRN dosing of alprazolam needed. Started her on low dose xyprexa for panic attacks. Patient needing close monitoring for symptoms this PM and titrating her O2 down this evening. Her urine is dark and concentrated. I think she will declare herself in next 24 hours in terms of needing [place]- husband wants her to go home and wants to "take care of her". I am not sure he will be able to do this and HCPOA and family feel her symptoms cannot be safely managed at home even with hospice care. May need a hospice facility transition from hospital. Plan is to see her tomorrow and decide which direction she wants to go with oral morphine vs. IV and he thoughts on hospice facility. She likely has lass than 4 weeks possibly much less. She accept this and tells her family tonight that she is "ready".   Time: 8PM-835PM Total Time: 35 minutes Greater than 50%  of this time was spent counseling and coordinating care related to the above assessment and plan.   Lane Hacker, DO Palliative Medicine [phone]

## 2015-08-13 NOTE — Progress Notes (Signed)
MD Janee Morn made aware that pt is now in afib RVR with a rate in the 190's, pt SOB sitting up in bed with husband and friend at bedside. Morphine bolus given to pt, will monitor pt.

## 2015-08-13 NOTE — Progress Notes (Signed)
   08/13/15 1600  Clinical Encounter Type  Visited With Patient;Family;Patient and family together  Visit Type Patient actively dying;Initial;Follow-up;Psychological support;Spiritual support  Referral From Palliative care team  Spiritual Encounters  Spiritual Needs Grief support;Emotional  CH met with pt and family on request from [place]; [place] offered emotional, spiritual and grief support to family and spoke briefly with pt; [place] available - [phone] if needed. Gwynn Burly 4:49 PM

## 2015-08-14 MED ORDER — GLYCOPYRROLATE 0.2 MG/ML IJ SOLN
0.2000 mg | Freq: Three times a day (TID) | INTRAMUSCULAR | Status: DC
  Administered 2015-08-14 – 2015-08-16 (×7): 0.2 mg via INTRAVENOUS
  Filled 2015-08-14 (×9): qty 1

## 2015-08-14 MED ORDER — GLYCOPYRROLATE 0.2 MG/ML IJ SOLN
0.1000 mg | Freq: Once | INTRAMUSCULAR | Status: AC
  Administered 2015-08-14: 0.1 mg via INTRAVENOUS
  Filled 2015-08-14: qty 0.5

## 2015-08-14 MED ORDER — WHITE PETROLATUM GEL
Status: AC
  Administered 2015-08-14: 0.2
  Filled 2015-08-14: qty 1

## 2015-08-14 NOTE — Progress Notes (Signed)
Daily Progress Note   Patient Name: Terri Stephens       Date: 08/14/2015 DOB: 09/12/1955  Age: 60 y.o. MRN#: [id number] Attending Physician: Rodolph Bong, MD Primary Care Physician: Chilton Greathouse, MD Admit Date: 08/08/2015  Reason for Consultation/Follow-up: Non pain symptom management, Pain control and Terminal Care  Subjective: Pt more comfortable on MS04 gt at 4mg  and hour basal and 4 mg prn as well as versed gtt. She is awakening to voice; asked RN this am for coffee but fell back asleep. She denied pain, dyspnea or anxiety to me ( shook her head no). Family at the bedside and feel she is comfortable.  Interval Events: Versed gtt and ms04 gtt started Length of Stay: 6 days  Current Medications: Scheduled Meds:  . antiseptic oral rinse  7 mL Mouth Rinse BID  . glycopyrrolate  0.2 mg Intravenous 3 times per day  . methylPREDNISolone (SOLU-MEDROL) injection  60 mg Intravenous Q24H  . nicotine  14 mg Transdermal Daily  . sodium chloride  3 mL Intravenous Q12H    Continuous Infusions: . midazolam (VERSED) infusion 1 mg/hr (08/14/15 0700)  . morphine 4 mg/hr (08/14/15 0700)    PRN Meds: sodium chloride, acetaminophen **OR** acetaminophen, antiseptic oral rinse, [DISCONTINUED] glycopyrrolate **OR** glycopyrrolate **OR** glycopyrrolate, haloperidol **OR** haloperidol **OR** haloperidol lactate, ipratropium, levalbuterol, midazolam, morphine, ondansetron **OR** ondansetron (ZOFRAN) IV, polyvinyl alcohol, sodium chloride  Physical Exam: Physical Exam  Constitutional:  Cachetic, acutely ill appearing female  Cardiovascular:  Tachy at 110  Pulmonary/Chest:  Mild increased work of breathing and use of accessory muscles  Neurological:  Awakens to voice; nods her head yes or no.    Skin:  Cool; faint mottling to knees                Vital Signs: BP 84/65 mmHg  Pulse 107  Temp(Src) 98.4 F (36.9 C) (Oral)  Resp 13  Ht 5\' 8"  (1.727 m)  Wt 56.2 kg (123 lb 14.4 oz)  BMI 18.84 kg/m2  SpO2 88% SpO2: SpO2: (!) 88 % O2 Device: O2 Device: Nasal Cannula O2 Flow Rate: O2 Flow Rate (L/min): 4 L/min  Intake/output summary:  Intake/Output Summary (Last 24 hours) at 08/14/15 1131 Last data filed at 08/14/15 1000  Gross per 24 hour  Intake    565 ml  Output  680 ml  Net   -115 ml   LBM: Last BM Date: 08/07/15 Baseline Weight: Weight: 54.432 kg (120 lb) Most recent weight: Weight: 56.2 kg (123 lb 14.4 oz)       Palliative Assessment/Data: Flowsheet Rows        Most Recent Value   Intake Tab    Referral Department  Hospitalist   Unit at Time of Referral  ICU   Palliative Care Primary Diagnosis  Pulmonary   Date Notified  08/11/15   Palliative Care Type  Return patient Palliative Care   Reason for referral  Clarify Goals of Care   Date of Admission  08/08/15   Date first seen by Palliative Care  08/11/15   # of days Palliative referral response time  0 Day(s)   # of days IP prior to Palliative referral  3   Clinical Assessment    Psychosocial & Spiritual Assessment    Palliative Care Outcomes       Additional Data Reviewed: CBC    Component Value Date/Time   WBC 13.5* 08/12/2015 0203   RBC 3.26* 08/12/2015 0203   HGB 10.3* 08/12/2015 0203   HCT 30.4* 08/12/2015 0203   PLT 277 08/12/2015 0203   MCV 93.3 08/12/2015 0203   MCH 31.6 08/12/2015 0203   MCHC 33.9 08/12/2015 0203   RDW 12.5 08/12/2015 0203   LYMPHSABS 0.6* 08/08/2015 1412   MONOABS 0.2 08/08/2015 1412   EOSABS 0.1 08/08/2015 1412   BASOSABS 0.0 08/08/2015 1412    CMP     Component Value Date/Time   NA 137 08/13/2015 0243   K 4.1 08/13/2015 0243   CL 92* 08/13/2015 0243   CO2 39* 08/13/2015 0243   GLUCOSE 119* 08/13/2015 0243   BUN 27* 08/13/2015 0243   CREATININE 0.54  08/13/2015 0243   CALCIUM 9.0 08/13/2015 0243   PROT 6.6 06/22/2015 0553   ALBUMIN 3.6 06/22/2015 0553   AST 20 06/22/2015 0553   ALT 18 06/22/2015 0553   ALKPHOS 67 06/22/2015 0553   BILITOT 0.5 06/22/2015 0553   GFRNONAA >60 08/13/2015 0243   GFRAA >60 08/13/2015 0243       Problem List:  Patient Active Problem List   Diagnosis Date Noted  . Chronic pain   . End stage COPD (HCC)   . Atypical chest pain 08/09/2015  . Abnormal EKG 08/09/2015  . Depression with anxiety   . Acute on chronic respiratory failure (HCC) 08/08/2015  . CAP (community acquired pneumonia) 08/08/2015  . Back pain   . Palliative care encounter 06/23/2015  . Acute respiratory failure with hypercapnia (HCC)   . Protein-calorie malnutrition, severe 06/06/2015  . COPD exacerbation (HCC) 06/03/2015  . Hypothyroidism 08/12/2007  . OTHER NEUTROPENIA 08/12/2007  . TOBACCO ABUSE 08/12/2007  . Coronary atherosclerosis 08/12/2007  . ASTHMA 08/12/2007  . C O P D 08/12/2007     Palliative Care Assessment & Plan    1.Code Status:  DNR    Code Status Orders        Start     Ordered   08/13/15 1512  Do not attempt resuscitation (DNR)   Continuous    Question Answer Comment  In the event of cardiac or respiratory ARREST Do not call a "code blue"   In the event of cardiac or respiratory ARREST Do not perform Intubation, CPR, defibrillation or ACLS   In the event of cardiac or respiratory ARREST Use medication by any route, position, wound care, and other measures to  relive pain and suffering. May use oxygen, suction and manual treatment of airway obstruction as needed for comfort.      08/13/15 1515    Code Status History    Date Active Date Inactive Code Status Order ID Comments User Context   08/08/2015  7:52 PM 08/13/2015  3:15 PM DNR [id number]  Gwenyth Bender, NP ED   06/22/2015  9:52 AM 06/27/2015  4:57 PM DNR [id number]  Simonne Martinet, NP ED   06/03/2015  6:09 PM 06/06/2015  5:22 PM Full Code  [id number]  Julio Sicks, NP Inpatient    Advance Directive Documentation        Most Recent Value   Type of Advance Directive  Living will   Pre-existing out of facility DNR order (yellow form or pink MOST form)     "MOST" Form in Place?         2. Goals of Care/Additional Recommendations:  Comfort care  Transfer to 6 [address]  Limitations on Scope of Treatment: Full Comfort Care  Desire for further Chaplaincy support:no  Psycho-social Needs: Grief/Bereavement Support  3. Symptom Management:      1.Pain: No boluses noted. Cont MS04 gtt unchanged for now. Monitor and titrate for effect.      2. Dyspnea: Improving on ms04 gtt. No changes.      3. Terminal agitation: Improving . Cont versed gtt unchanged for now. Monitor and titrate for effect.  4. Palliative Prophylaxis:   Delirium Protocol, Frequent Pain Assessment, Oral Care and Turn Reposition  5. Prognosis: Hours - Days  6. Discharge Planning:  Anticipate hospital death but if she stabilizes will approach family about in-pt hospice   Care plan was discussed with Dr. Janee Morn  Thank you for allowing the Palliative Medicine Team to assist in the care of this patient.   Time In: 0800 Time Out: 0825 Total Time 25 min Prolonged Time Billed  no         Irean Hong, NP  08/14/2015, 11:31 AM  Please contact Palliative Medicine Team phone at [phone] for questions and concerns.

## 2015-08-14 NOTE — Progress Notes (Signed)
Chart reviewed. Hospital death anticipated per MD. Significant change in condition since last note.  Comfort care in place.  Lorri Frederick. Jaci Lazier, [place] [phone] (weekend coverage)

## 2015-08-14 NOTE — Progress Notes (Signed)
[facility] PROGRESS NOTE  Terri Stephens MGN:[id number] DOB: 03/01/1956 DOA: 08/08/2015 PCP: Chilton Greathouse, MD  Brief History 60 year old female with history of end-stage COPD chronically on 2 Lcannula and prednisone 30 mg daily presented with a four-day history of progressive shortness of breath and increasing cough with sputum production. The patient was recently discharged from the hospital on 06/27/2015 after acute exacerbation of COPD. During that hospitalization, the patient required BiPAP. She was discharged with a slow prednisone taper. Prior to that hospitalization, the patient was hospitalized between November 3 through 06/06/2015. She denies any fevers, chills, chest pain, nausea, vomiting, diarrhea. Upon presentation, the patient was noted to be hypoxemic in the 80s. WBC was 13.0, lactic acid 1.26. Chest x-ray showed bibasilar heterogeneous opacities likely atelectasis. She was started on intravenous steroids and placed on BiPAP. The patient recently followed up with Dr. Isaiah Serge on on 07/07/2015 and started on Daliresp.  Patient took a turn with rapid deterioration with heart rate in the 190s, severe distress and terminal agitation. Patient has been seen by palliative care and patient is now full comfort measures. Patient currently on a morphine and Versed drip.   Assessment/Plan: #1 acute on chronic hypercarbic respiratory failure secondary to acute exacerbation of COPD with chronic O2 and steroid dependence/end-stage COPD Gold stage IV Patient in acute on chronic hypercarbic respiratory failure with less use of accessory muscles of respiration patient and less air hunger now, with end-stage COPD and currently a DNR/DNI status.  Patient noted to deteriorate yesterday with A. fib and RVR with heart rates in the 190s, with severe distress and terminal agitation. Patient was reassessed per palliative care yesterday and now on full comfort measures. Patient has been seen by pulmonary and  critical care medicine will recommended continuing ongoing current medical treatment with BiPAP as needed and revisiting hospice.  Patient  now on a morphine a Versed drip, and comfortable, with some improvement with less use of accessory muscles of respiration after being started on morphine gtt. Continue IV steroids,  morphine drip, Versed drip, Patient with a poor prognosis and doesnot want BIPAP. BIPAP has been d/c'd by pulmonary. Patient wishes for full  comfort. Palliative care  following and patient likely to have in-hospital death. Continue morphine and Versed drip. Robinul added for secretions. Appreciate palliative care input and recommendations.  #2 tobacco abuse Tobacco cessation. Patient recently resumed tobacco use over the Christmas holiday. Continue nicotine patch.  #3 atypical chest pain/chronic chest pain EKG with ST elevation V3 through V6 without reciprocal changes. Atypical in nature. May be secondary to musculoskeletal secondary to coughing. Cardiac enzymes negative 2. Placed on a PPI patient on chronic ibuprofen at home. GI cocktail as needed. Patient had a previous cardiac catheterization March 2016 and was nonobstructive. Cardiology was consulted and no further intervention was recommended. Pain management.  #4 anxiety/depression Patient on a Versed and morphine drip.   #5 chronic back pain Patient on morphine gtt.   #6 history of questionable rheumatoid arthritis Patient claims of had arthritis was on celebrex, a number of years while in [place]. Patient on IV steroids. Patient now for comfort measures.  #7 hypothyroidism TSH of 0.264. Synthroid has been discontinued as patient is now for comfort measures.  #8 severe protein calorie malnutrition Nutritional supplementation.  #9 prophylaxis Lovenox for DVT prophylaxis.  Code Status: DO NOT RESUSCITATE Family Communication: Updated patient and family at bedside. Disposition Plan: Transfer to [facility]. Likely  in-hospital death versus hospice home   Consultants:  PCCM: Dr Delton Coombes  08/10/2015  Cardiology: Dr. Patty Sermons 08/09/2015  Palliative Care Dr Phillips Odor 08/11/2015  Procedures:  Chest x-ray 08/08/2015  Antibiotics:  Levaquin 08/10/2015    HPI/Subjective: Patient sleeping however opens eyes to verbal stimuli. Events yesterday noted as patient took a dramatic turn with A. fib with RVR with heart rates in the 190s with severe anxiety distress and some terminal agitation. Patient more comfortable this morning. Family at bedside.   Objective: Filed Vitals:   08/14/15 0000 08/14/15 0400  BP: 89/58 90/67  Pulse: 111 110  Temp:    Resp: 9 12    Intake/Output Summary (Last 24 hours) at 08/14/15 0909 Last data filed at 08/14/15 0700  Gross per 24 hour  Intake    780 ml  Output    330 ml  Net    450 ml   Filed Weights   08/11/15 0317 08/12/15 0400 08/13/15 0500  Weight: 56.1 kg (123 lb 10.9 oz) 56.79 kg (125 lb 3.2 oz) 56.2 kg (123 lb 14.4 oz)    Exam:   General:   Sleeping. Less use of accessory pulses of respiration. Cachetic.  Cardiovascular: Tachycardic  Respiratory: poor air movement. Less Tight. Less Use of accessory muscles of respiration. Some coarse breath sounds anterior lung fields.  Abdomen: Soft/NT/ND/+BS  Musculoskeletal: No c/c/e  Data Reviewed: Basic Metabolic Panel:  Recent Labs Lab 08/09/15 0255 08/10/15 0220 08/11/15 0945 08/12/15 0203 08/13/15 0243  NA 133* 133* 133* 134* 137  K 3.9 4.5 4.1 4.7 4.1  CL 88* 90* 90* 90* 92*  CO2 34* 35* 35* 37* 39*  GLUCOSE 121* 135* 172* 135* 119*  BUN 12 21* 19 23* 27*  CREATININE 0.47 0.49 0.41* 0.48 0.54  CALCIUM 9.0 9.2 9.4 9.0 9.0  MG  --   --  2.1  --   --    Liver Function Tests: No results for input(s): AST, ALT, ALKPHOS, BILITOT, PROT, ALBUMIN in the last 168 hours. No results for input(s): LIPASE, AMYLASE in the last 168 hours. No results for input(s): AMMONIA in the last 168  hours. CBC:  Recent Labs Lab 08/08/15 1412 08/09/15 0255 08/10/15 0220 08/11/15 0945 08/12/15 0203  WBC 13.0* 7.1 10.5 12.8* 13.5*  NEUTROABS 12.2*  --   --   --   --   HGB 11.2* 9.8* 10.0* 11.2* 10.3*  HCT 33.7* 29.5* 30.1* 32.6* 30.4*  MCV 92.8 91.9 93.2 92.6 93.3  PLT 323 305 303 274 277   Cardiac Enzymes:  Recent Labs Lab 08/08/15 1412 08/09/15 1209  TROPONINI <0.03 <0.03   BNP (last 3 results)  Recent Labs  06/03/15 1956 06/22/15 0553  BNP 57.1 94.8    ProBNP (last 3 results) No results for input(s): PROBNP in the last 8760 hours.  CBG: No results for input(s): GLUCAP in the last 168 hours.  Recent Results (from the past 240 hour(s))  Culture, blood (routine x 2)     Status: None   Collection Time: 08/08/15  3:39 PM  Result Value Ref Range Status   Specimen Description BLOOD RIGHT HAND  Final   Special Requests BOTTLES DRAWN AEROBIC AND ANAEROBIC 5CC  Final   Culture NO GROWTH 5 DAYS  Final   Report Status 08/13/2015 FINAL  Final  Culture, blood (routine x 2)     Status: None   Collection Time: 08/08/15  3:44 PM  Result Value Ref Range Status   Specimen Description BLOOD LEFT HAND  Final   Special Requests BOTTLES DRAWN AEROBIC AND ANAEROBIC 5CC  Final   Culture NO GROWTH 5 DAYS  Final   Report Status 08/13/2015 FINAL  Final  MRSA PCR Screening     Status: None   Collection Time: 08/08/15  8:31 PM  Result Value Ref Range Status   MRSA by PCR NEGATIVE NEGATIVE Final    Comment:        The GeneXpert MRSA Assay (FDA approved for NASAL specimens only), is one component of a comprehensive MRSA colonization surveillance program. It is not intended to diagnose MRSA infection nor to guide or monitor treatment for MRSA infections.      Studies: No results found.  Scheduled Meds: . antiseptic oral rinse  7 mL Mouth Rinse BID  . glycopyrrolate  0.1 mg Intravenous Once  . glycopyrrolate  0.2 mg Intravenous 3 times per day  . methylPREDNISolone  (SOLU-MEDROL) injection  60 mg Intravenous Q24H  . nicotine  14 mg Transdermal Daily  . sodium chloride  3 mL Intravenous Q12H   Continuous Infusions: . midazolam (VERSED) infusion 1 mg/hr (08/13/15 1544)  . morphine 4 mg/hr (08/13/15 1510)    Principal Problem:   Acute on chronic respiratory failure (HCC) Active Problems:   COPD exacerbation (HCC)   Hypothyroidism   TOBACCO ABUSE   Protein-calorie malnutrition, severe   CAP (community acquired pneumonia)   Depression with anxiety   Atypical chest pain   Abnormal EKG   End stage COPD (HCC)   Chronic pain    Time spent: 35 mins    [facility] MD [facility] Pager [phone]. If 7PM-7AM, please contact night-coverage at [email or web address], password [facility] 08/14/2015, 9:09 AM  LOS: 6 days

## 2015-08-15 NOTE — Progress Notes (Signed)
   08/15/15 1700  Clinical Encounter Type  Visited With Patient;Family;Patient and family together;Health care provider  Visit Type Follow-up;Psychological support;Spiritual support;Social support;Patient actively dying  Referral From Nurse  Spiritual Encounters  Spiritual Needs Emotional;Grief support  [facility] visited with pt and family by request from RN; Pt actively dying; CH observed recognition and pt cognition and pt able to hold conversation and is aware of situation and states that she is not ready; pt and family offered support and CH available as needed. Erline Levine

## 2015-08-15 NOTE — Progress Notes (Signed)
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 The patient was recently discharged from the hospital on 06/27/2015 after acute exacerbation of COPD. During that hospitalization, the patient required BiPAP. She was discharged with a slow prednisone taper. Prior to that hospitalization, the patient was hospitalized between November 3 through 06/06/2015. She denies any fevers, chills, chest pain, nausea, vomiting, diarrhea. Upon presentation, the patient was noted to be hypoxemic in the 80s. WBC was 13.0, lactic acid 1.26. Chest x-ray showed bibasilar heterogeneous opacities likely atelectasis. She was started on intravenous steroids and placed on BiPAP. The patient recently followed up with Dr. Isaiah Serge on on 07/07/2015 and started on Daliresp.  Patient took a turn with rapid deterioration with heart rate in the 190s, severe distress and terminal agitation. Patient has been seen by palliative care and patient is now full comfort measures. Patient currently on a morphine and Versed drip.   Assessment/Plan: #1 acute on chronic hypercarbic respiratory failure secondary to acute exacerbation of COPD with chronic O2 and steroid dependence/end-stage COPD Gold stage IV Patient in acute on chronic hypercarbic respiratory failure with less use of accessory muscles of respiration patient and less air hunger now, with end-stage COPD and currently a DNR/DNI status.  Patient noted to deteriorate yesterday with A. fib and RVR with heart rates in the 190s, with severe distress and terminal agitation. Patient was reassessed per palliative care yesterday and now on full comfort measures. Patient has been seen by pulmonary and  critical care medicine will recommended continuing ongoing current medical treatment with BiPAP as needed and revisiting hospice.  Patient  now on a morphine a Versed drip, and comfortable, with some improvement with less use of accessory muscles of respiration after being started on morphine gtt. Continue IV steroids,  morphine drip, Versed drip, Patient with a poor prognosis and doesnot want BIPAP. BIPAP has been d/c'd by pulmonary. Patient wishes for full  comfort. Palliative care  following and patient likely to have in-hospital death. Continue morphine and Versed drip. Robinul added for secretions. Appreciate palliative care input and recommendations.  #2 tobacco abuse Tobacco cessation. Patient recently resumed tobacco use over the Christmas holiday. Continue nicotine patch.  #3 atypical chest pain/chronic chest pain EKG with ST elevation V3 through V6 without reciprocal changes. Atypical in nature. May be secondary to musculoskeletal secondary to coughing. Cardiac enzymes negative 2. Placed on a PPI patient on chronic ibuprofen at home. GI cocktail as needed. Patient had a previous cardiac catheterization March 2016 and was nonobstructive. Cardiology was consulted and no further intervention was recommended. Pain management.  #4 anxiety/depression Patient on a Versed and morphine drip.   #5 chronic back pain Patient on morphine gtt.   #6 history of questionable rheumatoid arthritis Patient claims of had arthritis was on celebrex, a number of years while in [place]. Patient on IV steroids. Patient now for comfort measures.  #7 hypothyroidism TSH of 0.264. Synthroid has been discontinued as patient is now for comfort measures.  #8 severe protein calorie malnutrition Nutritional supplementation.  #9 prophylaxis Lovenox for DVT prophylaxis.  Code Status: DO NOT RESUSCITATE Family Communication: Updated patient and family at bedside. Disposition Plan: Likely in-hospital death versus  hospice home   Consultants:  PCCM: Dr Delton Coombes 08/10/2015  Cardiology:  Dr. Patty Sermons 08/09/2015  Palliative Care Dr Phillips Odor 08/11/2015  Procedures:  Chest x-ray 08/08/2015  Antibiotics:  Levaquin 08/10/2015    HPI/Subjective: Patient sleeping comfortably with some agonal breaths.  Objective: Filed Vitals:   08/15/15 0500 08/15/15 1334  BP: 121/69 118/66  Pulse: 103 102  Temp: 98.5 F (36.9 C) 98.8 F (37.1 C)  Resp: 19 20    Intake/Output Summary (Last 24 hours) at 08/15/15 1920 Last data filed at 08/15/15 1309  Gross per 24 hour  Intake    423 ml  Output    200 ml  Net    223 ml   Filed Weights   08/11/15 0317 08/12/15 0400 08/13/15 0500  Weight: 56.1 kg (123 lb 10.9 oz) 56.79 kg (125 lb 3.2 oz) 56.2 kg (123 lb 14.4 oz)    Exam:   General:   Sleeping. Agonal breaths. Comfortable.  Cardiovascular: Tachycardic  Respiratory: agonal breaths  Abdomen: Soft/NT/ND/+BS  Musculoskeletal: No c/c/e  Data Reviewed: Basic Metabolic Panel:  Recent Labs Lab 08/09/15 0255 08/10/15 0220 08/11/15 0945 08/12/15 0203 08/13/15 0243  NA 133* 133* 133* 134* 137  K 3.9 4.5 4.1 4.7 4.1  CL 88* 90* 90* 90* 92*  CO2 34* 35* 35* 37* 39*  GLUCOSE 121* 135* 172* 135* 119*  BUN 12 21* 19 23* 27*  CREATININE 0.47 0.49 0.41* 0.48 0.54  CALCIUM 9.0 9.2 9.4 9.0 9.0  MG  --   --  2.1  --   --    Liver Function Tests: No results for input(s): AST, ALT, ALKPHOS, BILITOT, PROT, ALBUMIN in the last 168 hours. No results for input(s): LIPASE, AMYLASE in the last 168 hours. No results for input(s): AMMONIA in the last 168 hours. CBC:  Recent Labs Lab 08/09/15 0255 08/10/15 0220 08/11/15 0945 08/12/15 0203  WBC 7.1 10.5 12.8* 13.5*  HGB 9.8* 10.0* 11.2* 10.3*  HCT 29.5* 30.1* 32.6* 30.4*  MCV 91.9 93.2 92.6 93.3  PLT 305 303 274 277   Cardiac Enzymes:  Recent Labs Lab 08/09/15 1209  TROPONINI <0.03   BNP (last 3 results)  Recent Labs  06/03/15 1956  06/22/15 0553  BNP 57.1 94.8    ProBNP (last 3 results) No results for input(s): PROBNP in the last 8760 hours.  CBG: No results for input(s): GLUCAP in the last 168 hours.  Recent Results (from the past 240 hour(s))  Culture, blood (routine x 2)     Status: None   Collection Time: 08/08/15  3:39 PM  Result Value Ref Range Status   Specimen Description BLOOD RIGHT HAND  Final   Special Requests BOTTLES DRAWN AEROBIC AND ANAEROBIC 5CC  Final   Culture NO GROWTH 5 DAYS  Final   Report Status 08/13/2015 FINAL  Final  Culture, blood (routine x 2)     Status: None   Collection Time: 08/08/15  3:44 PM  Result Value Ref Range Status   Specimen Description BLOOD LEFT HAND  Final   Special Requests BOTTLES DRAWN AEROBIC AND ANAEROBIC 5CC  Final   Culture NO GROWTH 5 DAYS  Final   Report Status 08/13/2015 FINAL  Final  MRSA PCR Screening     Status: None   Collection Time: 08/08/15  8:31 PM  Result Value Ref Range Status   MRSA by PCR NEGATIVE NEGATIVE Final    Comment:        The GeneXpert MRSA Assay (FDA approved for NASAL specimens only), is one component of a comprehensive MRSA colonization surveillance program.  It is not intended to diagnose MRSA infection nor to guide or monitor treatment for MRSA infections.      Studies: No results found.  Scheduled Meds: . antiseptic oral rinse  7 mL Mouth Rinse BID  . glycopyrrolate  0.2 mg Intravenous 3 times per day  . methylPREDNISolone (SOLU-MEDROL) injection  60 mg Intravenous Q24H  . nicotine  14 mg Transdermal Daily  . sodium chloride  3 mL Intravenous Q12H   Continuous Infusions: . midazolam (VERSED) infusion 1 mg/hr (08/14/15 2057)  . morphine 4 mg/hr (08/14/15 0700)    Principal Problem:   Acute on chronic respiratory failure (HCC) Active Problems:   COPD exacerbation (HCC)   Hypothyroidism   TOBACCO ABUSE   Protein-calorie malnutrition, severe   CAP (community acquired pneumonia)   Depression with anxiety    Atypical chest pain   Abnormal EKG   End stage COPD (HCC)   Chronic pain    Time spent: 35 mins    [facility] MD [facility] Pager [phone]. If 7PM-7AM, please contact night-coverage at [email or web address], password [facility] 08/15/2015, 7:20 PM  LOS: 7 days

## 2015-08-15 NOTE — Progress Notes (Signed)
Daily Progress Note   Patient Name: Terri Stephens       Date: 08/15/2015 DOB: [birth year]  Age: 60 y.o. MRN#: [id number] Attending Physician: Rodolph Bong, MD Primary Care Physician: Chilton Greathouse, MD Admit Date: 08/08/2015  Reason for Consultation/Follow-up: Non pain symptom management and Pain control  Subjective: Pt more alert, anxious and confused. She is awake when I come to visit sipping on a soda. Tells me "get someone in here with me". She is asking for her husband and friend. She then will drift back off to sleep. No boluses given per RN on the floor. As she currently appears anxious and did ask the RN to give versed bolus Interval Events: More alert Length of Stay: 7 days  Current Medications: Scheduled Meds:  . antiseptic oral rinse  7 mL Mouth Rinse BID  . glycopyrrolate  0.2 mg Intravenous 3 times per day  . methylPREDNISolone (SOLU-MEDROL) injection  60 mg Intravenous Q24H  . nicotine  14 mg Transdermal Daily  . sodium chloride  3 mL Intravenous Q12H    Continuous Infusions: . midazolam (VERSED) infusion 1 mg/hr (08/14/15 2057)  . morphine 4 mg/hr (08/14/15 0700)    PRN Meds: sodium chloride, acetaminophen **OR** acetaminophen, antiseptic oral rinse, [DISCONTINUED] glycopyrrolate **OR** glycopyrrolate **OR** glycopyrrolate, haloperidol **OR** haloperidol **OR** haloperidol lactate, ipratropium, levalbuterol, midazolam, morphine, ondansetron **OR** ondansetron (ZOFRAN) IV, polyvinyl alcohol, sodium chloride  Physical Exam: Physical Exam  Constitutional:  Acutely ill older female. She is awake, talking, confused and agitated at times  Pulmonary/Chest:  Mild increased work of breathing  Neurological:  Awakens to voice but also spontaneously awakens. Asking for  her friend and husband  Skin: Skin is warm and dry.  Psychiatric:  anxious                Vital Signs: BP 118/66 mmHg  Pulse 102  Temp(Src) 98.8 F (37.1 C) (Oral)  Resp 20  Ht 5\' 8"  (1.727 m)  Wt 56.2 kg (123 lb 14.4 oz)  BMI 18.84 kg/m2  SpO2 93% SpO2: SpO2: 93 % O2 Device: O2 Device: Nasal Cannula O2 Flow Rate: O2 Flow Rate (L/min): 4 L/min  Intake/output summary:  Intake/Output Summary (Last 24 hours) at 08/15/15 1831 Last data filed at 08/15/15 1309  Gross per 24 hour  Intake    423 ml  Output    200 ml  Net    223 ml   LBM: Last BM Date: 08/07/15 Baseline Weight: Weight: 54.432 kg (120 lb) Most recent weight: Weight: 56.2 kg (123 lb 14.4 oz)       Palliative Assessment/Data: Flowsheet Rows        Most Recent Value   Intake Tab    Referral Department  Hospitalist   Unit at Time of Referral  ICU   Palliative Care Primary Diagnosis  Pulmonary   Date Notified  08/11/15   Palliative Care Type  Return patient Palliative Care   Reason for referral  Clarify Goals of Care   Date of Admission  08/08/15   Date first seen by Palliative Care  08/11/15   # of days Palliative referral response time  0 Day(s)   # of days IP prior to Palliative referral  3   Clinical Assessment    Psychosocial & Spiritual Assessment    Palliative Care Outcomes       Additional Data Reviewed: CBC    Component Value Date/Time   WBC 13.5* 08/12/2015 0203   RBC 3.26* 08/12/2015 0203   HGB 10.3* 08/12/2015 0203   HCT 30.4* 08/12/2015 0203   PLT 277 08/12/2015 0203   MCV 93.3 08/12/2015 0203   MCH 31.6 08/12/2015 0203   MCHC 33.9 08/12/2015 0203   RDW 12.5 08/12/2015 0203   LYMPHSABS 0.6* 08/08/2015 1412   MONOABS 0.2 08/08/2015 1412   EOSABS 0.1 08/08/2015 1412   BASOSABS 0.0 08/08/2015 1412    CMP     Component Value Date/Time   NA 137 08/13/2015 0243   K 4.1 08/13/2015 0243   CL 92* 08/13/2015 0243   CO2 39* 08/13/2015 0243   GLUCOSE 119* 08/13/2015 0243   BUN 27*  08/13/2015 0243   CREATININE 0.54 08/13/2015 0243   CALCIUM 9.0 08/13/2015 0243   PROT 6.6 06/22/2015 0553   ALBUMIN 3.6 06/22/2015 0553   AST 20 06/22/2015 0553   ALT 18 06/22/2015 0553   ALKPHOS 67 06/22/2015 0553   BILITOT 0.5 06/22/2015 0553   GFRNONAA >60 08/13/2015 0243   GFRAA >60 08/13/2015 0243       Problem List:  Patient Active Problem List   Diagnosis Date Noted  . Chronic pain   . End stage COPD (HCC)   . Atypical chest pain 08/09/2015  . Abnormal EKG 08/09/2015  . Depression with anxiety   . Acute on chronic respiratory failure (HCC) 08/08/2015  . CAP (community acquired pneumonia) 08/08/2015  . Back pain   . Palliative care encounter 06/23/2015  . Acute respiratory failure with hypercapnia (HCC)   . Protein-calorie malnutrition, severe 06/06/2015  . COPD exacerbation (HCC) 06/03/2015  . Hypothyroidism 08/12/2007  . OTHER NEUTROPENIA 08/12/2007  . TOBACCO ABUSE 08/12/2007  . Coronary atherosclerosis 08/12/2007  . ASTHMA 08/12/2007  . C O P D 08/12/2007     Palliative Care Assessment & Plan    1.Code Status:  DNR    Code Status Orders        Start     Ordered   08/13/15 1512  Do not attempt resuscitation (DNR)   Continuous    Question Answer Comment  In the event of cardiac or respiratory ARREST Do not call a "code blue"   In the event of cardiac or respiratory ARREST Do not perform Intubation, CPR, defibrillation or ACLS   In the event of cardiac or respiratory ARREST Use medication by any route, position, wound  care, and other measures to relive pain and suffering. May use oxygen, suction and manual treatment of airway obstruction as needed for comfort.      08/13/15 1515    Code Status History    Date Active Date Inactive Code Status Order ID Comments User Context   08/08/2015  7:52 PM 08/13/2015  3:15 PM DNR [id number]  Gwenyth Bender, NP ED   06/22/2015  9:52 AM 06/27/2015  4:57 PM DNR [id number]  Simonne Martinet, NP ED   06/03/2015  6:09 PM  06/06/2015  5:22 PM Full Code [id number]  Julio Sicks, NP Inpatient    Advance Directive Documentation        Most Recent Value   Type of Advance Directive  Living will   Pre-existing out of facility DNR order (yellow form or pink MOST form)     "MOST" Form in Place?         2. Goals of Care/Additional Recommendations:  Continue comfort care ms04 gtt and versed gtt unchanged for now  Revaluate hospice in-pt eligibility  Limitations on Scope of Treatment: Full Comfort Care  Desire for further Chaplaincy support:no 3. Symptom Management:      1.Pain: Pt denies pain. Cont current MS04 gtt at 4mg /hr and prn. Monitor for improving LOC overnight though pt could be having a rally       2. Dyspnea: Improved on MS04 gtt. Continue unchanged for now. Pt has become more alert overnight and at this point it is not clear if this is something she can maintain or if we are seeing a rally       3. Agitation: Pt seems anxious when awake. Asked RN to give bolus now but no boluses given in over 8 hours. Continue versed gtt unchanged for now. Will reevaluate in am  4. Palliative Prophylaxis:   Aspiration, Delirium Protocol, Frequent Pain Assessment, Oral Care and Turn Reposition  5. Prognosis: Still see pt as critically ill despite rally overnight with prognosis of probably days to weeks.   6. Discharge Planning: TBD. Maybe in-pt hospice candidate   Thank you for allowing the Palliative Medicine Team to assist in the care of this patient.   Time In: 1600 Time Out: 1625 Total Time 25 min Prolonged Time Billed  no         1626, NP  08/15/2015, 6:31 PM  Please contact Palliative Medicine Team phone at [phone] for questions and concerns.

## 2015-08-16 MED ORDER — METHYLPREDNISOLONE SODIUM SUCC 40 MG IJ SOLR
40.0000 mg | INTRAMUSCULAR | Status: DC
  Administered 2015-08-17 – 2015-08-18 (×2): 40 mg via INTRAVENOUS
  Filled 2015-08-16 (×2): qty 1

## 2015-08-16 MED ORDER — METHYLNALTREXONE BROMIDE 12 MG/0.6ML ~~LOC~~ SOLN
12.0000 mg | SUBCUTANEOUS | Status: DC | PRN
  Filled 2015-08-16: qty 0.6

## 2015-08-16 MED ORDER — GLYCOPYRROLATE 0.2 MG/ML IJ SOLN
0.1000 mg | Freq: Two times a day (BID) | INTRAMUSCULAR | Status: DC
  Administered 2015-08-16 – 2015-08-20 (×8): 0.1 mg via INTRAVENOUS
  Filled 2015-08-16 (×8): qty 1

## 2015-08-16 NOTE — Progress Notes (Signed)
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 The patient was recently discharged from the hospital on 06/27/2015 after acute exacerbation of COPD. During that hospitalization, the patient required BiPAP. She was discharged with a slow prednisone taper. Prior to that hospitalization, the patient was hospitalized between November 3 through 06/06/2015. She denies any fevers, chills, chest pain, nausea, vomiting, diarrhea. Upon presentation, the patient was noted to be hypoxemic in the 80s. WBC was 13.0, lactic acid 1.26. Chest x-ray showed bibasilar heterogeneous opacities likely atelectasis. She was started on intravenous steroids and placed on BiPAP. The patient recently followed up with Dr. Isaiah Serge on on 07/07/2015 and started on Daliresp.  Patient took a turn with rapid deterioration with heart rate in the 190s, severe distress and terminal agitation. Patient has been seen by palliative care and patient is now full comfort measures. Patient currently on a morphine and Versed drip.   Assessment/Plan: #1 acute on chronic hypercarbic respiratory failure secondary to acute exacerbation of COPD with chronic O2 and steroid dependence/end-stage COPD Gold stage IV Patient in acute on chronic hypercarbic respiratory failure with less use of accessory muscles of respiration patient and less air hunger now, with end-stage COPD and currently a DNR/DNI status.  Patient noted to deteriorate yesterday with A. fib and RVR with heart rates in the 190s, with severe distress and terminal agitation. Patient was reassessed per palliative care yesterday and now on full comfort measures. Patient has been seen by pulmonary and  critical care medicine will recommended continuing ongoing current medical treatment with BiPAP as needed and revisiting hospice.  Patient  now on a morphine a Versed drip, and comfortable, with some improvement with less use of accessory muscles of respiration after being started on morphine gtt. Continue IV steroids,  morphine drip, Versed drip, Patient with a poor prognosis and doesnot want BIPAP. BIPAP has been d/c'd by pulmonary. Patient wishes for full  comfort. Palliative care  following and patient likely to have in-hospital death. Continue morphine and Versed drip. Robinul added for secretions. Appreciate palliative care input and recommendations.  #2 tobacco abuse Tobacco cessation. Patient recently resumed tobacco use over the Christmas holiday. Continue nicotine patch.  #3 atypical chest pain/chronic chest pain EKG with ST elevation V3 through V6 without reciprocal changes. Atypical in nature. May be secondary to musculoskeletal secondary to coughing. Cardiac enzymes negative 2. Placed on a PPI patient on chronic ibuprofen at home. GI cocktail as needed. Patient had a previous cardiac catheterization March 2016 and was nonobstructive. Cardiology was consulted and no further intervention was recommended. Pain management.  #4 anxiety/depression Patient on a Versed and morphine drip.   #5 chronic back pain Patient on morphine gtt.   #6 history of questionable rheumatoid arthritis Patient claims of had arthritis was on celebrex, a number of years while in [place]. Patient on IV steroids. Patient now for comfort measures.  #7 hypothyroidism TSH of 0.264. Synthroid has been discontinued as patient is now for comfort measures.  #8 severe protein calorie malnutrition Nutritional supplementation.  #9 prophylaxis Lovenox for DVT prophylaxis.  Code Status: DO NOT RESUSCITATE Family Communication: Updated patient and family at bedside. Disposition Plan: Likely hospice  home   Consultants:  PCCM: Dr Delton Coombes 08/10/2015  Cardiology: Dr. Patty Sermons 08/09/2015  Palliative Care Dr Phillips Odor 08/11/2015  Procedures:  Chest x-ray 08/08/2015  Antibiotics:  Levaquin 08/10/2015    HPI/Subjective: Patient sitting up in bed alert. Denies any chest pain. Family at bedside.  Objective: Filed Vitals:   08/16/15 0523 08/16/15 1450  BP: 126/64 124/72  Pulse: 78 84  Temp: 98.4 F (36.9 C) 98.4 F (36.9 C)  Resp: 19 18    Intake/Output Summary (Last 24 hours) at 08/16/15 1651 Last data filed at 08/16/15 1558  Gross per 24 hour  Intake    360 ml  Output    600 ml  Net   -240 ml   Filed Weights   08/11/15 0317 08/12/15 0400 08/13/15 0500  Weight: 56.1 kg (123 lb 10.9 oz) 56.79 kg (125 lb 3.2 oz) 56.2 kg (123 lb 14.4 oz)    Exam:   General:   Sitting up in bed comfortable.  Cardiovascular: RRR  Respiratory: Coarse anterior breath sounds.  Abdomen: Soft/NT/ND/+BS  Musculoskeletal: No c/c/e  Data Reviewed: Basic Metabolic Panel:  Recent Labs Lab 08/10/15 0220 08/11/15 0945 08/12/15 0203 08/13/15 0243  NA 133* 133* 134* 137  K 4.5 4.1 4.7 4.1  CL 90* 90* 90* 92*  CO2 35* 35* 37* 39*  GLUCOSE 135* 172* 135* 119*  BUN 21* 19 23* 27*  CREATININE 0.49 0.41* 0.48 0.54  CALCIUM 9.2 9.4 9.0 9.0  MG  --  2.1  --   --    Liver Function Tests: No results for input(s): AST, ALT, ALKPHOS, BILITOT, PROT, ALBUMIN in the last 168 hours. No results for input(s): LIPASE, AMYLASE in the last 168 hours. No results for input(s): AMMONIA in the last 168 hours. CBC:  Recent Labs Lab 08/10/15 0220 08/11/15 0945 08/12/15 0203  WBC 10.5 12.8* 13.5*  HGB 10.0* 11.2* 10.3*  HCT 30.1* 32.6* 30.4*  MCV 93.2 92.6 93.3  PLT 303 274 277   Cardiac Enzymes: No results for input(s): CKTOTAL, CKMB, CKMBINDEX, TROPONINI in the last 168 hours. BNP (last 3 results)  Recent Labs  06/03/15 1956 06/22/15 0553  BNP 57.1 94.8    ProBNP (last 3  results) No results for input(s): PROBNP in the last 8760 hours.  CBG: No results for input(s): GLUCAP in the last 168 hours.  Recent Results (from the past 240 hour(s))  Culture, blood (routine x 2)     Status: None   Collection Time: 08/08/15  3:39 PM  Result Value Ref Range Status   Specimen Description BLOOD RIGHT HAND  Final   Special Requests BOTTLES DRAWN AEROBIC AND ANAEROBIC 5CC  Final   Culture NO GROWTH 5 DAYS  Final   Report Status 08/13/2015 FINAL  Final  Culture, blood (routine x 2)     Status: None   Collection Time: 08/08/15  3:44 PM  Result Value Ref Range Status   Specimen Description BLOOD LEFT HAND  Final   Special Requests BOTTLES DRAWN AEROBIC AND ANAEROBIC 5CC  Final   Culture NO GROWTH 5 DAYS  Final   Report Status 08/13/2015 FINAL  Final  MRSA PCR Screening     Status: None   Collection Time: 08/08/15  8:31 PM  Result Value Ref Range Status   MRSA by PCR NEGATIVE NEGATIVE Final    Comment:        The GeneXpert MRSA Assay (FDA approved for NASAL specimens only), is one component of a comprehensive MRSA colonization surveillance program. It is not intended to diagnose MRSA infection nor to guide or monitor treatment  for MRSA infections.      Studies: No results found.  Scheduled Meds: . antiseptic oral rinse  7 mL Mouth Rinse BID  . glycopyrrolate  0.1 mg Intravenous BID  . [START ON 08/17/2015] methylPREDNISolone (SOLU-MEDROL) injection  40 mg Intravenous Q24H  . nicotine  14 mg Transdermal Daily  . sodium chloride  3 mL Intravenous Q12H   Continuous Infusions: . midazolam (VERSED) infusion 1 mg/hr (08/14/15 2057)  . morphine 4 mg/hr (08/15/15 2347)    Principal Problem:   Acute on chronic respiratory failure (HCC) Active Problems:   COPD exacerbation (HCC)   Hypothyroidism   TOBACCO ABUSE   Protein-calorie malnutrition, severe   CAP (community acquired pneumonia)   Depression with anxiety   Atypical chest pain   Abnormal EKG    End stage COPD (HCC)   Chronic pain    Time spent: 35 mins    [facility] MD [facility] Pager [phone]. If 7PM-7AM, please contact night-coverage at [email or web address], password [facility] 08/16/2015, 4:51 PM  LOS: 8 days

## 2015-08-16 NOTE — Progress Notes (Signed)
Nutrition Brief Note  Chart reviewed. Pt now transitioning to comfort care.  No further nutrition interventions warranted at this time.  Please re-consult as needed.   Claus Silvestro A. Shritha Bresee, RD, LDN, CDE Pager: [phone] After hours Pager: [phone]  

## 2015-08-16 NOTE — Progress Notes (Signed)
Patient has stabilized over the weekend- now on versed infusion at 1mg /h and morphine infusion at 4mg /hr. She is arousable and comfortable. She has no complaints. According to family she was "worked up this AM" and much more energetic. She has a short prognosis- very appropriate for referral to hospice facility.   , DO Palliative Medicine [phone]

## 2015-08-16 NOTE — Progress Notes (Signed)
Patient's husband adamantly recommending she get something to help her have a bowel movement.  Patient states she has not been taking much PO and denies any abdominal discomfort.  MD paged and ordered enema.  Patient's husband left & she refuses enema at this time.  Will follow-up.

## 2015-08-17 NOTE — Progress Notes (Signed)
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 The patient was recently discharged from the hospital on 06/27/2015 after acute exacerbation of COPD. During that hospitalization, the patient required BiPAP. She was discharged with a slow prednisone taper. Prior to that hospitalization, the patient was hospitalized between November 3 through 06/06/2015. She denies any fevers, chills, chest pain, nausea, vomiting, diarrhea. Upon presentation, the patient was noted to be hypoxemic in the 80s. WBC was 13.0, lactic acid 1.26. Chest x-ray showed bibasilar heterogeneous opacities likely atelectasis. She was started on intravenous steroids and placed on BiPAP. The patient recently followed up with Dr. Isaiah Serge on on 07/07/2015 and started on Daliresp.  Patient took a turn with rapid deterioration with heart rate in the 190s, severe distress and terminal agitation. Patient has been seen by palliative care and patient is now full comfort measures. Patient currently on a morphine and Versed drip.   Assessment/Plan: #1 acute on chronic hypercarbic respiratory failure secondary to acute exacerbation of COPD with chronic O2 and steroid dependence/end-stage COPD Gold stage IV Patient in acute on chronic hypercarbic respiratory failure with less use of accessory muscles of respiration patient and less air hunger now, with end-stage COPD and currently a DNR/DNI status.  Patient noted to deteriorate yesterday with A. fib and RVR with heart rates in the 190s, with severe distress and terminal agitation. Patient was reassessed per palliative care yesterday and now on full comfort measures. Patient has been seen by pulmonary and  critical care medicine will recommended continuing ongoing current medical treatment with BiPAP as needed and revisiting hospice.  Patient  now on a morphine a Versed drip, and comfortable, with some improvement with less use of accessory muscles of respiration after being started on morphine gtt. Continue IV steroids,  morphine drip, Versed drip, Patient with a poor prognosis and doesnot want BIPAP. BIPAP has been d/c'd by pulmonary. Patient wishes for full  comfort. Palliative care  following and patient likely to have in-hospital death. Continue morphine and Versed drip. Robinul added for secretions. Appreciate palliative care input and recommendations.  #2 tobacco abuse Tobacco cessation. Patient recently resumed tobacco use over the Christmas holiday. Continue nicotine patch.  #3 atypical chest pain/chronic chest pain EKG with ST elevation V3 through V6 without reciprocal changes. Atypical in nature. May be secondary to musculoskeletal secondary to coughing. Cardiac enzymes negative 2. Placed on a PPI patient on chronic ibuprofen at home. GI cocktail as needed. Patient had a previous cardiac catheterization March 2016 and was nonobstructive. Cardiology was consulted and no further intervention was recommended. Pain management.  #4 anxiety/depression Patient on a Versed and morphine drip.   #5 chronic back pain Patient on morphine gtt.   #6 history of questionable rheumatoid arthritis Patient claims of had arthritis was on celebrex, a number of years while in [place]. Patient on IV steroids. Patient now for comfort measures.  #7 hypothyroidism TSH of 0.264. Synthroid has been discontinued as patient is now for comfort measures.  #8 severe protein calorie malnutrition Nutritional supplementation.  #9 prophylaxis Lovenox for DVT prophylaxis.  Code Status: DO NOT RESUSCITATE Family Communication: Updated patient and husband at bedside. Disposition Plan: Likely hospice home vs in  hospital death.   Consultants:  PCCM: Dr Delton Coombes 08/10/2015  Cardiology: Dr. Patty Sermons 08/09/2015  Palliative Care Dr Phillips Odor 08/11/2015  Procedures:  Chest x-ray 08/08/2015  Antibiotics:  Levaquin 08/10/2015    HPI/Subjective: Patient resting comfortably.  Objective: Filed Vitals:   08/16/15 2100 08/17/15 0610  BP: 114/60 146/51  Pulse: 84 88  Temp: 98.2 F (36.8 C) 98 F (36.7 C)  Resp: 18 18    Intake/Output Summary (Last 24 hours) at 08/17/15 2047 Last data filed at 08/17/15 1852  Gross per 24 hour  Intake    478 ml  Output    650 ml  Net   -172 ml   Filed Weights   08/11/15 0317 08/12/15 0400 08/13/15 0500  Weight: 56.1 kg (123 lb 10.9 oz) 56.79 kg (125 lb 3.2 oz) 56.2 kg (123 lb 14.4 oz)    Exam:   General:   Patient with agonal breaths.  Cardiovascular: RRR  Respiratory: Coarse anterior breath sounds.  Abdomen: Soft/NT/ND/+BS  Musculoskeletal: No c/c/e  Data Reviewed: Basic Metabolic Panel:  Recent Labs Lab 08/11/15 0945 08/12/15 0203 08/13/15 0243  NA 133* 134* 137  K 4.1 4.7 4.1  CL 90* 90* 92*  CO2 35* 37* 39*  GLUCOSE 172* 135* 119*  BUN 19 23* 27*  CREATININE 0.41* 0.48 0.54  CALCIUM 9.4 9.0 9.0  MG 2.1  --   --    Liver Function Tests: No results for input(s): AST, ALT, ALKPHOS, BILITOT, PROT, ALBUMIN in the last 168 hours. No results for input(s): LIPASE, AMYLASE in the last 168 hours. No results for input(s): AMMONIA in the last 168 hours. CBC:  Recent Labs Lab 08/11/15 0945 08/12/15 0203  WBC 12.8* 13.5*  HGB 11.2* 10.3*  HCT 32.6* 30.4*  MCV 92.6 93.3  PLT 274 277   Cardiac Enzymes: No results for input(s): CKTOTAL, CKMB, CKMBINDEX, TROPONINI in the last 168 hours. BNP (last 3 results)  Recent Labs  06/03/15 1956 06/22/15 0553  BNP 57.1 94.8    ProBNP (last 3 results) No results for input(s): PROBNP in the last 8760 hours.  CBG: No results for input(s): GLUCAP in the last 168 hours.  Recent  Results (from the past 240 hour(s))  Culture, blood (routine x 2)     Status: None   Collection Time: 08/08/15  3:39 PM  Result Value Ref Range Status   Specimen Description BLOOD RIGHT HAND  Final   Special Requests BOTTLES DRAWN AEROBIC AND ANAEROBIC 5CC  Final   Culture NO GROWTH 5 DAYS  Final   Report Status 08/13/2015 FINAL  Final  Culture, blood (routine x 2)     Status: None   Collection Time: 08/08/15  3:44 PM  Result Value Ref Range Status   Specimen Description BLOOD LEFT HAND  Final   Special Requests BOTTLES DRAWN AEROBIC AND ANAEROBIC 5CC  Final   Culture NO GROWTH 5 DAYS  Final   Report Status 08/13/2015 FINAL  Final  MRSA PCR Screening     Status: None   Collection Time: 08/08/15  8:31 PM  Result Value Ref Range Status   MRSA by PCR NEGATIVE NEGATIVE Final    Comment:        The GeneXpert MRSA Assay (FDA approved for NASAL specimens only), is one component of a comprehensive MRSA colonization surveillance program. It is not intended to diagnose MRSA infection nor to guide or monitor treatment for MRSA infections.      Studies: No results found.  Scheduled Meds: . antiseptic oral rinse  7 mL Mouth Rinse BID  .  glycopyrrolate  0.1 mg Intravenous BID  . methylPREDNISolone (SOLU-MEDROL) injection  40 mg Intravenous Q24H  . nicotine  14 mg Transdermal Daily  . sodium chloride  3 mL Intravenous Q12H   Continuous Infusions: . midazolam (VERSED) infusion 1 mg/hr (08/16/15 2022)  . morphine 4 mg/hr (08/17/15 0010)    Principal Problem:   Acute on chronic respiratory failure (HCC) Active Problems:   COPD exacerbation (HCC)   Hypothyroidism   TOBACCO ABUSE   Protein-calorie malnutrition, severe   CAP (community acquired pneumonia)   Depression with anxiety   Atypical chest pain   Abnormal EKG   End stage COPD (HCC)   Chronic pain    Time spent: 35 mins    [facility] MD [facility] Pager [phone]. If 7PM-7AM, please contact  night-coverage at [email or web address], password [facility] 08/17/2015, 8:47 PM  LOS: 9 days

## 2015-08-17 NOTE — Progress Notes (Signed)
Pt very lethargic. Opens eyes briefly to voice. Not verbally engaging in speech at this time

## 2015-08-17 NOTE — Progress Notes (Signed)
Pt's family refusing q2hrly turns when offered. Hospice representative working on transfer to hospice house

## 2015-08-17 NOTE — Progress Notes (Signed)
Pt continues to have agonal breathing. Not responding to voice prompts. No s/s of pain/discomfort noted

## 2015-08-17 NOTE — Consult Note (Signed)
[facility] Liaison:  [facility] referral received 08/12/15. Chart reviewed and met with HCPOA and spouse 08/13/15. Attempted to meet with patient 08/13/15 but she declined visit at that time due to wanting to focus on meal. Received request for continued interest 08/16/15 after patient became more stable over weekend. Unfortunately no [employer] availability today 08/17/15. Plan to follow up with HCPOA and spouse later this morning to continue conversation. Please do not hesitate to call with questions.   Thank you. Erling Conte, [facility]

## 2015-08-18 MED ORDER — FUROSEMIDE 10 MG/ML IJ SOLN
40.0000 mg | Freq: Once | INTRAMUSCULAR | Status: AC
  Administered 2015-08-18: 40 mg via INTRAVENOUS
  Filled 2015-08-18: qty 4

## 2015-08-18 NOTE — Progress Notes (Signed)
[facility] PROGRESS NOTE  Terri Stephens WER:[id number] DOB: 1956-03-02 DOA: 08/08/2015 PCP: Chilton Greathouse, MD    HPI/Subjective: Patient resting comfortably. 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 The patient was recently discharged from the hospital on 06/27/2015 after acute exacerbation of COPD. During that hospitalization, the patient required BiPAP. She was discharged with a slow prednisone taper. Prior to that hospitalization, the patient was hospitalized between November 3 through 06/06/2015. She denies any fevers, chills, chest pain, nausea, vomiting, diarrhea. Upon presentation, the patient was noted to be hypoxemic in the 80s. WBC was 13.0, lactic acid 1.26. Chest x-ray showed bibasilar heterogeneous opacities likely atelectasis. She was started on intravenous steroids and placed on BiPAP. The patient recently followed up with Dr. Isaiah Serge on on 07/07/2015 and started on Daliresp.  Patient took a turn with rapid deterioration with heart rate in the 190s, severe distress and terminal agitation. Patient has been seen by palliative care and patient is now full comfort measures. Patient currently on a morphine and Versed drip.   Assessment/Plan: #1 acute on chronic hypercarbic respiratory failure secondary to acute exacerbation of COPD with chronic O2 and steroid dependence/end-stage COPD Gold stage IV Patient in acute on chronic hypercarbic respiratory failure with less use of accessory muscles of respiration patient and less air hunger now, with end-stage COPD and currently a DNR/DNI status.  Patient noted to deteriorate yesterday with A. fib and RVR with heart rates in the 190s, with severe  distress and terminal agitation. Patient was reassessed per palliative care yesterday and now on full comfort measures. Patient has been seen by pulmonary and critical care medicine will recommended continuing ongoing current medical treatment with BiPAP as needed and revisiting hospice.  Patient  now on a morphine a Versed drip, and comfortable, with some improvement with less use of accessory muscles of respiration after being started on morphine gtt. Continue IV steroids,  morphine drip, Versed drip, Patient with a poor prognosis and doesnot want BIPAP. BIPAP has been d/c'd by pulmonary. Patient wishes for full  comfort. Palliative care  following and patient likely to have in-hospital death. Continue morphine and Versed drip. Robinul added for secretions. Appreciate palliative care input and recommendations.  #2 tobacco abuse Tobacco cessation. Patient recently resumed tobacco use over the Christmas holiday. Continue nicotine patch.  #3 atypical chest pain/chronic chest pain EKG with ST elevation V3 through V6 without reciprocal changes. Atypical in nature. May be secondary to musculoskeletal secondary to coughing. Cardiac enzymes negative 2. Placed on a PPI patient on chronic ibuprofen at home. GI cocktail as needed. Patient had a previous cardiac catheterization March 2016 and was nonobstructive. Cardiology was consulted and no further intervention was recommended. Pain management.  #4 anxiety/depression Patient on a Versed and morphine drip.   #5 chronic back pain Patient on morphine gtt.   #6 history of questionable rheumatoid arthritis Patient claims of had arthritis was on celebrex, a number of years while in [place]. Patient on IV steroids. Patient now for comfort measures.  #7 hypothyroidism TSH of 0.264. Synthroid has been discontinued as patient is now for comfort measures.  #8 severe protein calorie malnutrition Nutritional supplementation.  #9 prophylaxis Lovenox for DVT  prophylaxis.  Code Status: DO NOT RESUSCITATE Family  Communication: Updated patient and husband at bedside. Disposition Plan: Likely hospice home vs in hospital death.   Consultants:  PCCM: Dr Delton Coombes 08/10/2015  Cardiology: Dr. Patty Sermons 08/09/2015  Palliative Care Dr Phillips Odor 08/11/2015  Procedures:  Chest x-ray 08/08/2015  Antibiotics:  Levaquin 08/10/2015     Objective: Filed Vitals:   08/17/15 2143 08/18/15 0300  BP: 132/77 109/63  Pulse: 100 89  Temp: 99.1 F (37.3 C) 99.6 F (37.6 C)  Resp: 19 18    Intake/Output Summary (Last 24 hours) at 08/18/15 1317 Last data filed at 08/18/15 1138  Gross per 24 hour  Intake 11068.83 ml  Output   1000 ml  Net 10068.83 ml   Filed Weights   08/11/15 0317 08/12/15 0400 08/13/15 0500  Weight: 56.1 kg (123 lb 10.9 oz) 56.79 kg (125 lb 3.2 oz) 56.2 kg (123 lb 14.4 oz)    Exam:   General:   Patient with agonal breaths.  Cardiovascular: RRR  Respiratory: Coarse anterior breath sounds.  Abdomen: Soft/NT/ND/+BS  Musculoskeletal: No c/c/e  Data Reviewed: Basic Metabolic Panel:  Recent Labs Lab 08/12/15 0203 08/13/15 0243  NA 134* 137  K 4.7 4.1  CL 90* 92*  CO2 37* 39*  GLUCOSE 135* 119*  BUN 23* 27*  CREATININE 0.48 0.54  CALCIUM 9.0 9.0   Liver Function Tests: No results for input(s): AST, ALT, ALKPHOS, BILITOT, PROT, ALBUMIN in the last 168 hours. No results for input(s): LIPASE, AMYLASE in the last 168 hours. No results for input(s): AMMONIA in the last 168 hours. CBC:  Recent Labs Lab 08/12/15 0203  WBC 13.5*  HGB 10.3*  HCT 30.4*  MCV 93.3  PLT 277   Cardiac Enzymes: No results for input(s): CKTOTAL, CKMB, CKMBINDEX, TROPONINI in the last 168 hours. BNP (last 3 results)  Recent Labs  06/03/15 1956 06/22/15 0553  BNP 57.1 94.8    ProBNP (last 3 results) No results for input(s): PROBNP in the last 8760 hours.  CBG: No results for input(s): GLUCAP in the last 168  hours.  Recent Results (from the past 240 hour(s))  Culture, blood (routine x 2)     Status: None   Collection Time: 08/08/15  3:39 PM  Result Value Ref Range Status   Specimen Description BLOOD RIGHT HAND  Final   Special Requests BOTTLES DRAWN AEROBIC AND ANAEROBIC 5CC  Final   Culture NO GROWTH 5 DAYS  Final   Report Status 08/13/2015 FINAL  Final  Culture, blood (routine x 2)     Status: None   Collection Time: 08/08/15  3:44 PM  Result Value Ref Range Status   Specimen Description BLOOD LEFT HAND  Final   Special Requests BOTTLES DRAWN AEROBIC AND ANAEROBIC 5CC  Final   Culture NO GROWTH 5 DAYS  Final   Report Status 08/13/2015 FINAL  Final  MRSA PCR Screening     Status: None   Collection Time: 08/08/15  8:31 PM  Result Value Ref Range Status   MRSA by PCR NEGATIVE NEGATIVE Final    Comment:        The GeneXpert MRSA Assay (FDA approved for NASAL specimens only), is one component of a comprehensive MRSA colonization surveillance program. It is not intended to diagnose MRSA infection nor to guide or monitor treatment for MRSA infections.      Studies: No results found.  Scheduled Meds: . antiseptic oral rinse  7 mL Mouth Rinse BID  . glycopyrrolate  0.1 mg Intravenous BID  . methylPREDNISolone (SOLU-MEDROL)  injection  40 mg Intravenous Q24H  . nicotine  14 mg Transdermal Daily  . sodium chloride  3 mL Intravenous Q12H   Continuous Infusions: . midazolam (VERSED) infusion 1 mg/hr (08/16/15 2022)  . morphine 4 mg/hr (08/17/15 0010)    Principal Problem:   Acute on chronic respiratory failure (HCC) Active Problems:   Hypothyroidism   TOBACCO ABUSE   COPD exacerbation (HCC)   Protein-calorie malnutrition, severe   CAP (community acquired pneumonia)   Depression with anxiety   Atypical chest pain   Abnormal EKG   End stage COPD (HCC)   Chronic pain    Time spent: 35 mins    [facility] MD [facility] Pager [phone]. If 7PM-7AM, please  contact night-coverage at [email or web address], password [facility] 08/18/2015, 1:17 PM  LOS: 10 days

## 2015-08-18 NOTE — Progress Notes (Signed)
Terri Stephens continues to have fluctuating LOC - she is alert and awake at certain intervals and when sleeping she can appear to have very abnormal even agonal respirations. She is clearly declining and needs a hospice facility for EOL care- family agreeable for transfer when there is a bed available. Has required intermittent boluses of medication for breakthrough dyspnea and pain. Continue close monitoring and continue current level of care.   Anderson Malta, DO Palliative Medicine

## 2015-08-19 MED ORDER — MORPHINE SULFATE 25 MG/ML IV SOLN: 4.0000 mg/h | INTRAVENOUS | Status: AC

## 2015-08-19 MED ORDER — METHYLPREDNISOLONE SODIUM SUCC 40 MG IJ SOLR
20.0000 mg | INTRAMUSCULAR | Status: DC
  Administered 2015-08-19: 20 mg via INTRAVENOUS
  Filled 2015-08-19: qty 1

## 2015-08-19 MED ORDER — HALOPERIDOL LACTATE 2 MG/ML PO CONC: 0.5000 mg | ORAL | Status: DC | PRN

## 2015-08-19 MED ORDER — SODIUM CHLORIDE 0.9 % IV SOLN: 1.0000 mg/h | INTRAVENOUS | Status: DC

## 2015-08-19 MED ORDER — HALOPERIDOL LACTATE 2 MG/ML PO CONC: 0.5000 mg | ORAL | Status: AC | PRN

## 2015-08-19 MED ORDER — MORPHINE SULFATE 25 MG/ML IV SOLN: 4.0000 mg/h | INTRAVENOUS | Status: DC

## 2015-08-19 NOTE — Progress Notes (Signed)
CSW just spoke with hospice rep who needs to get RX/meds in the building and paperwork completed by family/HCPOA. Plan will be for dc tomorrow morning to [facility].   Reece Levy, MSW, [facility] Majors  [phone]

## 2015-08-19 NOTE — Progress Notes (Signed)
This CSW rec'd call from [facility]- they have a bed today and are able to accept patient-  I have communicated with MD and await her assessment today.   Reece Levy, MSW, [facility]ors  [phone]

## 2015-08-19 NOTE — Discharge Summary (Addendum)
Physician Discharge Summary  Amulya Quintin WUJ:[id number] DOB: 07/29/1956 DOA: 08/08/2015  PCP: Chilton Greathouse, MD  Admit date: 08/08/2015 Discharge date: 08/19/2015  Time spent: greater than 30 minutes  Recommendations for Outpatient Follow-up:  1. To residential hospice for end of life care   Discharge Diagnoses:  Principal Problem:   Acute on chronic respiratory failure (HCC) Active Problems:   Hypothyroidism   TOBACCO ABUSE   COPD exacerbation (HCC)   Protein-calorie malnutrition, severe   CAP (community acquired pneumonia)   Depression with anxiety   Atypical chest pain   Abnormal EKG   End stage COPD (HCC)   Chronic pain   Discharge Condition: stable  Diet recommendation: general  Filed Weights   08/11/15 0317 08/12/15 0400 08/13/15 0500  Weight: 56.1 kg (123 lb 10.9 oz) 56.79 kg (125 lb 3.2 oz) 56.2 kg (123 lb 14.4 oz)    History of present illness/Hospital Course:  60 year old female with end-stage COPD, on chronic prednisone and oxygen presented with worsening shortness of breath cough and sputum production. She was hypoxic into the 80s, started on steroids and BiPAP. Seen by pulmonary critical care as well as palliative care. She continued to worsen despite maximal therapies. After discussion with the husband, she was made comfort care. She was started on a morphine and Versed drip. She is currently comfortable and stable for transfer to residential hospice. Anticipate prognosis in terms of days to a week or so.  Procedures:  [place]  Consultations:  PCCM  Cardiology  Palliative medicine  Discharge Exam: Filed Vitals:   08/18/15 2224 08/19/15 0505  BP: 103/66 146/82  Pulse: 73 81  Temp: 99.1 F (37.3 C) 97.6 F (36.4 C)  Resp: 20 18    General: Comfortable. Asleep. Arousable. Answers questions appropriately. Cardiovascular: Regular rate rhythm without murmurs gallops rubs Respiratory: Diminished throughout  Discharge Instructions   Discharge  Instructions    Diet general    Complete by:  As directed      Walk with assistance    Complete by:  As directed             Medication List    STOP taking these medications        albuterol (2.5 MG/3ML) 0.083% nebulizer solution  Commonly known as:  PROVENTIL     albuterol 108 (90 Base) MCG/ACT inhaler  Commonly known as:  PROVENTIL HFA;VENTOLIN HFA     ALPRAZolam 0.25 MG tablet  Commonly known as:  XANAX     aspirin EC 81 MG tablet     budesonide-formoterol 160-4.5 MCG/ACT inhaler  Commonly known as:  SYMBICORT     furosemide 20 MG tablet  Commonly known as:  LASIX     guaiFENesin 600 MG 12 hr tablet  Commonly known as:  MUCINEX     ibuprofen 800 MG tablet  Commonly known as:  ADVIL,MOTRIN     levothyroxine 50 MCG tablet  Commonly known as:  SYNTHROID, LEVOTHROID     morphine CONCENTRATE 10 MG/0.5ML Soln concentrated solution     MULTIVITAMIN & MINERAL PO     nicotine 14 mg/24hr patch  Commonly known as:  NICODERM CQ - dosed in mg/24 hours     predniSONE 10 MG tablet  Commonly known as:  DELTASONE     predniSONE 5 MG tablet  Commonly known as:  DELTASONE     PROAIR HFA 108 (90 Base) MCG/ACT inhaler  Generic drug:  albuterol     roflumilast 500 MCG Tabs tablet  Commonly known  as:  DALIRESP     sertraline 100 MG tablet  Commonly known as:  ZOLOFT     tiotropium 18 MCG inhalation capsule  Commonly known as:  SPIRIVA     valACYclovir 1000 MG tablet  Commonly known as:  VALTREX      TAKE these medications        haloperidol 2 MG/ML solution  Commonly known as:  HALDOL  Place 0.3 mLs (0.6 mg total) under the tongue every 4 (four) hours as needed for agitation (or delirium).     ipratropium-albuterol 0.5-2.5 (3) MG/3ML Soln  Commonly known as:  DUONEB  Take 3 mLs by nebulization every 4 (four) hours.     morphine 250 mg in dextrose 5 % 250 mL  Inject 4 mg/hr into the vein continuous.       Allergies  Allergen Reactions  . Bee Venom  Anaphylaxis  . Oxycodone Hcl Shortness Of Breath, Itching and Swelling    REACTION: All "codones" in any form      The results of significant diagnostics from this hospitalization (including imaging, microbiology, ancillary and laboratory) are listed below for reference.    Significant Diagnostic Studies: Dg Chest Port 1 View  08/08/2015  CLINICAL DATA:  Patient with cough and COPD. EXAM: PORTABLE CHEST 1 VIEW COMPARISON:  Chest radiograph 06/22/2015. FINDINGS: Stable cardiac and mediastinal contours. Minimal heterogeneous opacities bilateral lung bases. No large area of pulmonary consolidation. No pleural effusion or pneumothorax. IMPRESSION: Basilar heterogeneous opacities favored represent atelectasis. Electronically Signed   By: Annia Belt M.D.   On: 08/08/2015 14:38    Microbiology: No results found for this or any previous visit (from the past 240 hour(s)).   Labs: Basic Metabolic Panel:  Recent Labs Lab 08/13/15 0243  NA 137  K 4.1  CL 92*  CO2 39*  GLUCOSE 119*  BUN 27*  CREATININE 0.54  CALCIUM 9.0   Liver Function Tests: No results for input(s): AST, ALT, ALKPHOS, BILITOT, PROT, ALBUMIN in the last 168 hours. No results for input(s): LIPASE, AMYLASE in the last 168 hours. No results for input(s): AMMONIA in the last 168 hours. CBC: No results for input(s): WBC, NEUTROABS, HGB, HCT, MCV, PLT in the last 168 hours. Cardiac Enzymes: No results for input(s): CKTOTAL, CKMB, CKMBINDEX, TROPONINI in the last 168 hours. BNP: BNP (last 3 results)  Recent Labs  06/03/15 1956 06/22/15 0553  BNP 57.1 94.8    ProBNP (last 3 results) No results for input(s): PROBNP in the last 8760 hours.  CBG: No results for input(s): GLUCAP in the last 168 hours.     Signed:  Christiane Ha MD.  [facility] 08/19/2015, 1:25 PM

## 2015-08-20 ENCOUNTER — Ambulatory Visit: Payer: BLUE CROSS/BLUE SHIELD | Admitting: Pulmonary Disease

## 2015-08-20 NOTE — Progress Notes (Signed)
Wasted 100 mgs. of her Morphine sin. to sink and was witnessed by nurse Clayborne Dana RN.

## 2015-08-20 NOTE — Progress Notes (Signed)
Pt to be d/c today to [facility].    Pt and family agreeable. Confirmed plans with facility.  Plan transfer via EMS with a pick up time at .  Am.  Nurse given message to bolus Pt with morphine prior to transport and number to call report.     Leron Croak LCSWA  [facility]  [phone]

## 2015-08-20 NOTE — Progress Notes (Addendum)
Patient discharged to [facility], report was given to nurse Kizzie Fantasia, pt's IV line was not removed per nurse Eunice Blase from [place]'s request.

## 2015-08-20 NOTE — Progress Notes (Signed)
I witnessed of Morphine wasted by Alferd Apa RN.

## 2015-08-23 ENCOUNTER — Telehealth: Payer: Self-pay | Admitting: Pulmonary Disease

## 2015-09-01 NOTE — Telephone Encounter (Signed)
Called spoke with Dorene Sorrow from hospice. Confirmed pt passed away this AM. FYI for Dr. Isaiah Serge.

## 2015-09-01 DEATH — deceased

## 2015-09-24 ENCOUNTER — Ambulatory Visit: Payer: BLUE CROSS/BLUE SHIELD | Admitting: Pulmonary Disease

## 2016-12-02 IMAGING — RF DG ESOPHAGUS
3 series · 12 of 12 positions shown · non-contrast
Comparison: None.

CLINICAL DATA: Aspiration.  Severe COPD.

EXAM:
ESOPHOGRAM / BARIUM SWALLOW / BARIUM TABLET STUDY
TECHNIQUE: Combined double contrast and single contrast examination performed
using effervescent crystals, thick barium liquid, and thin barium
liquid. The patient was observed with fluoroscopy swallowing a 13 mm
barium sulphate tablet.
FLUOROSCOPY TIME:  Fluoroscopy Time:  1 minutes 0 seconds

[Series 1: cp_standard · 0.36mm/px · 4 of 69 frames shown (1 of 3)]
[frame 11/69]
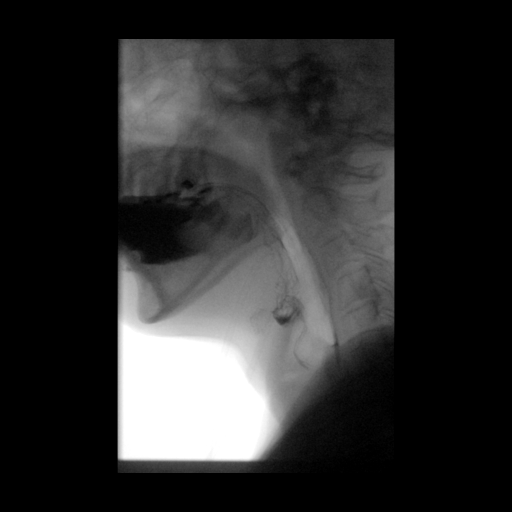
[frame 30/69]
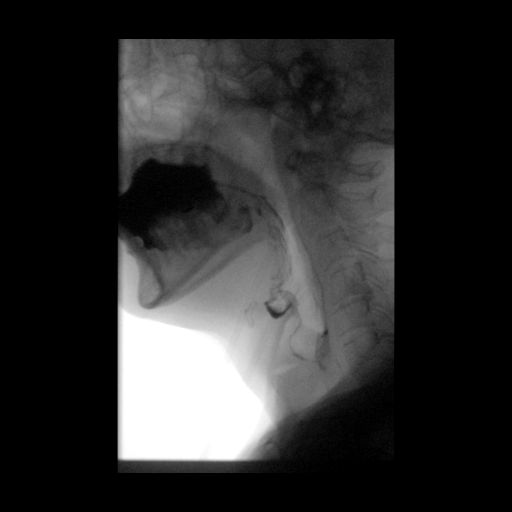
[frame 35/69]
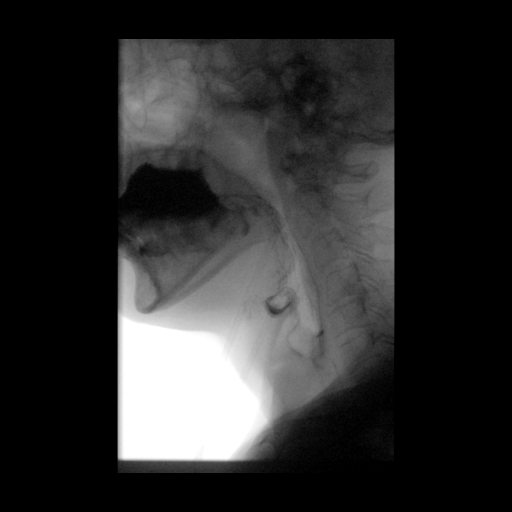
[frame 59/69]
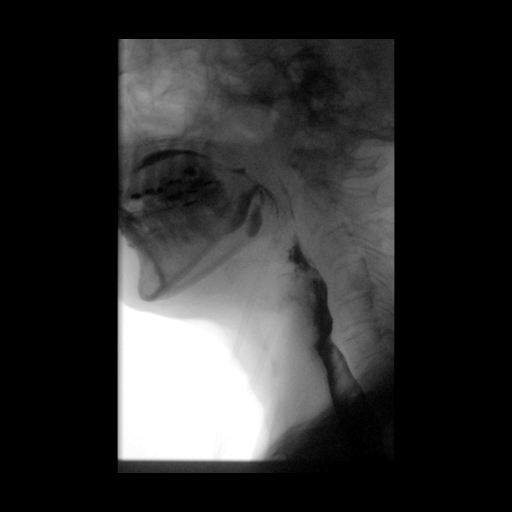

[Series 2: cp_standard · 0.57mm/px · 4 of 153 frames shown (2 of 3)]
[frame 6/153]
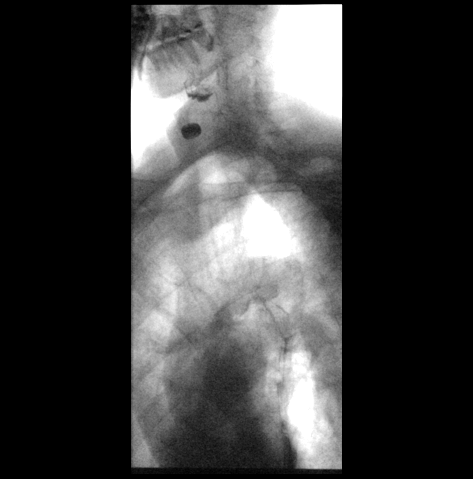
[frame 23/153]
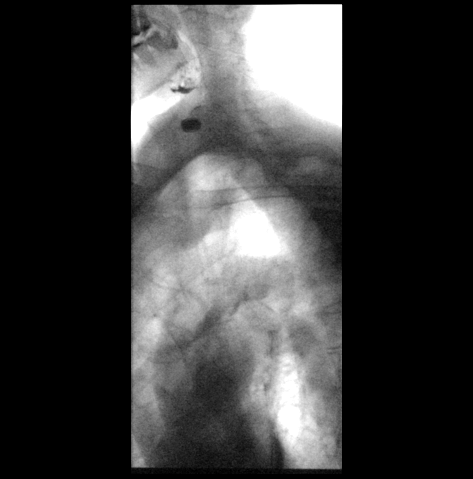
[frame 77/153]
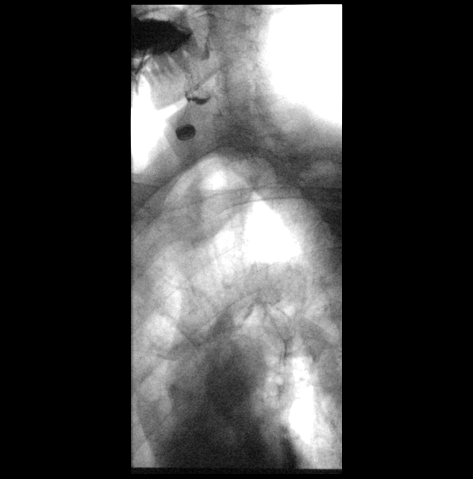
[frame 131/153]
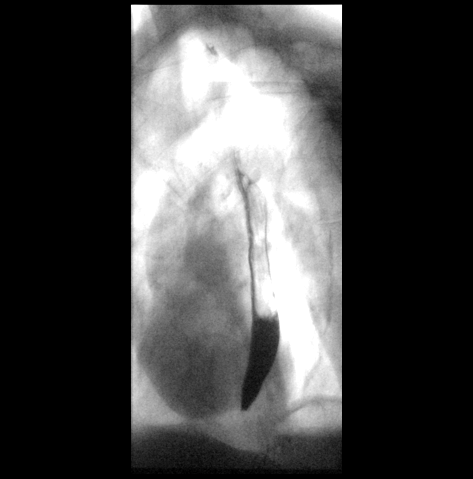

[Series 3: cp_standard · 0.57mm/px · 4 of 124 frames shown (3 of 3)]
[frame 19/124]
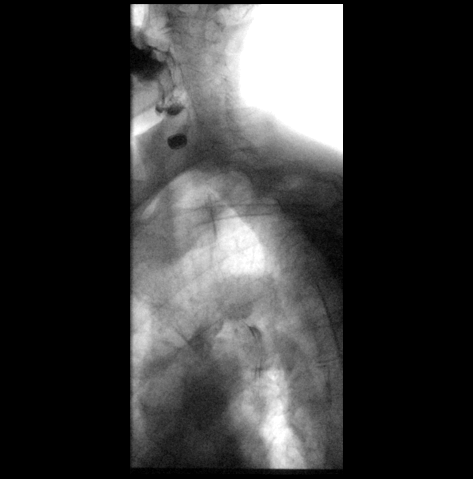
[frame 51/124]
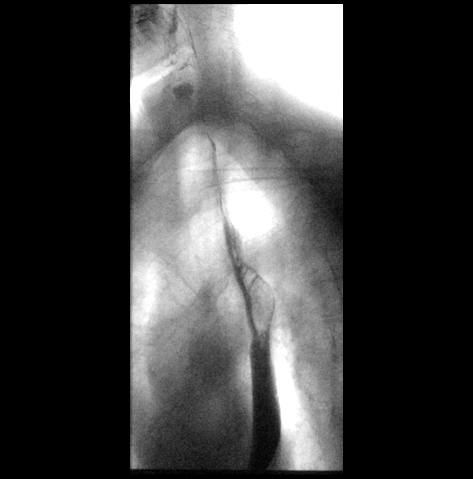
[frame 63/124]
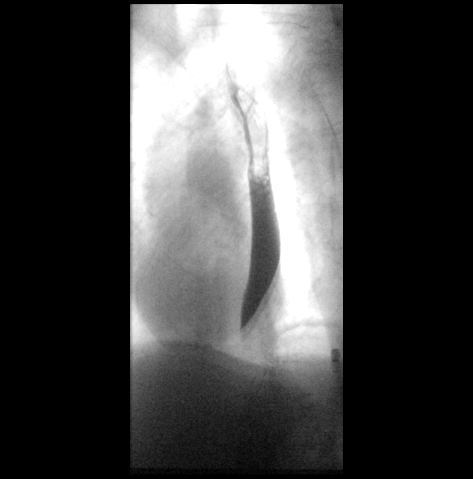
[frame 106/124]
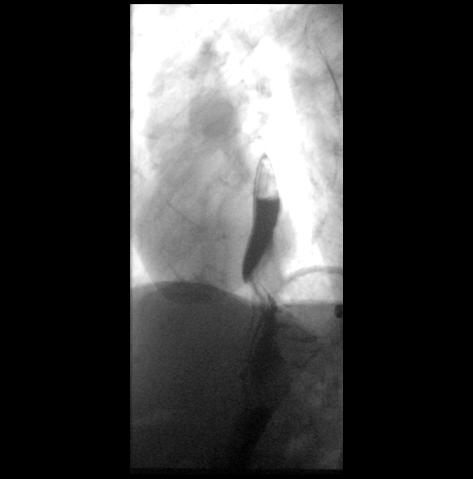

[12 of 12 positions shown; findings below may reference images not displayed]

FINDINGS: The oropharyngeal swallowing mechanisms are normal. The mucosa and
motility of the esophagus are normal. A 13 mm barium tablet passed
immediately from the mouth to the stomach with no delay. No
appreciable hiatal hernia.
IMPRESSION: Normal barium esophagram. No aspiration. No hiatal hernia or
esophagitis.

## 2017-02-05 IMAGING — CR DG CHEST 1V PORT
1 series · 1 of 1 positions shown · non-contrast
Comparison: Chest radiograph 06/22/2015.

CLINICAL DATA: Patient with cough and COPD.

EXAM:
PORTABLE CHEST 1 VIEW

[AP]
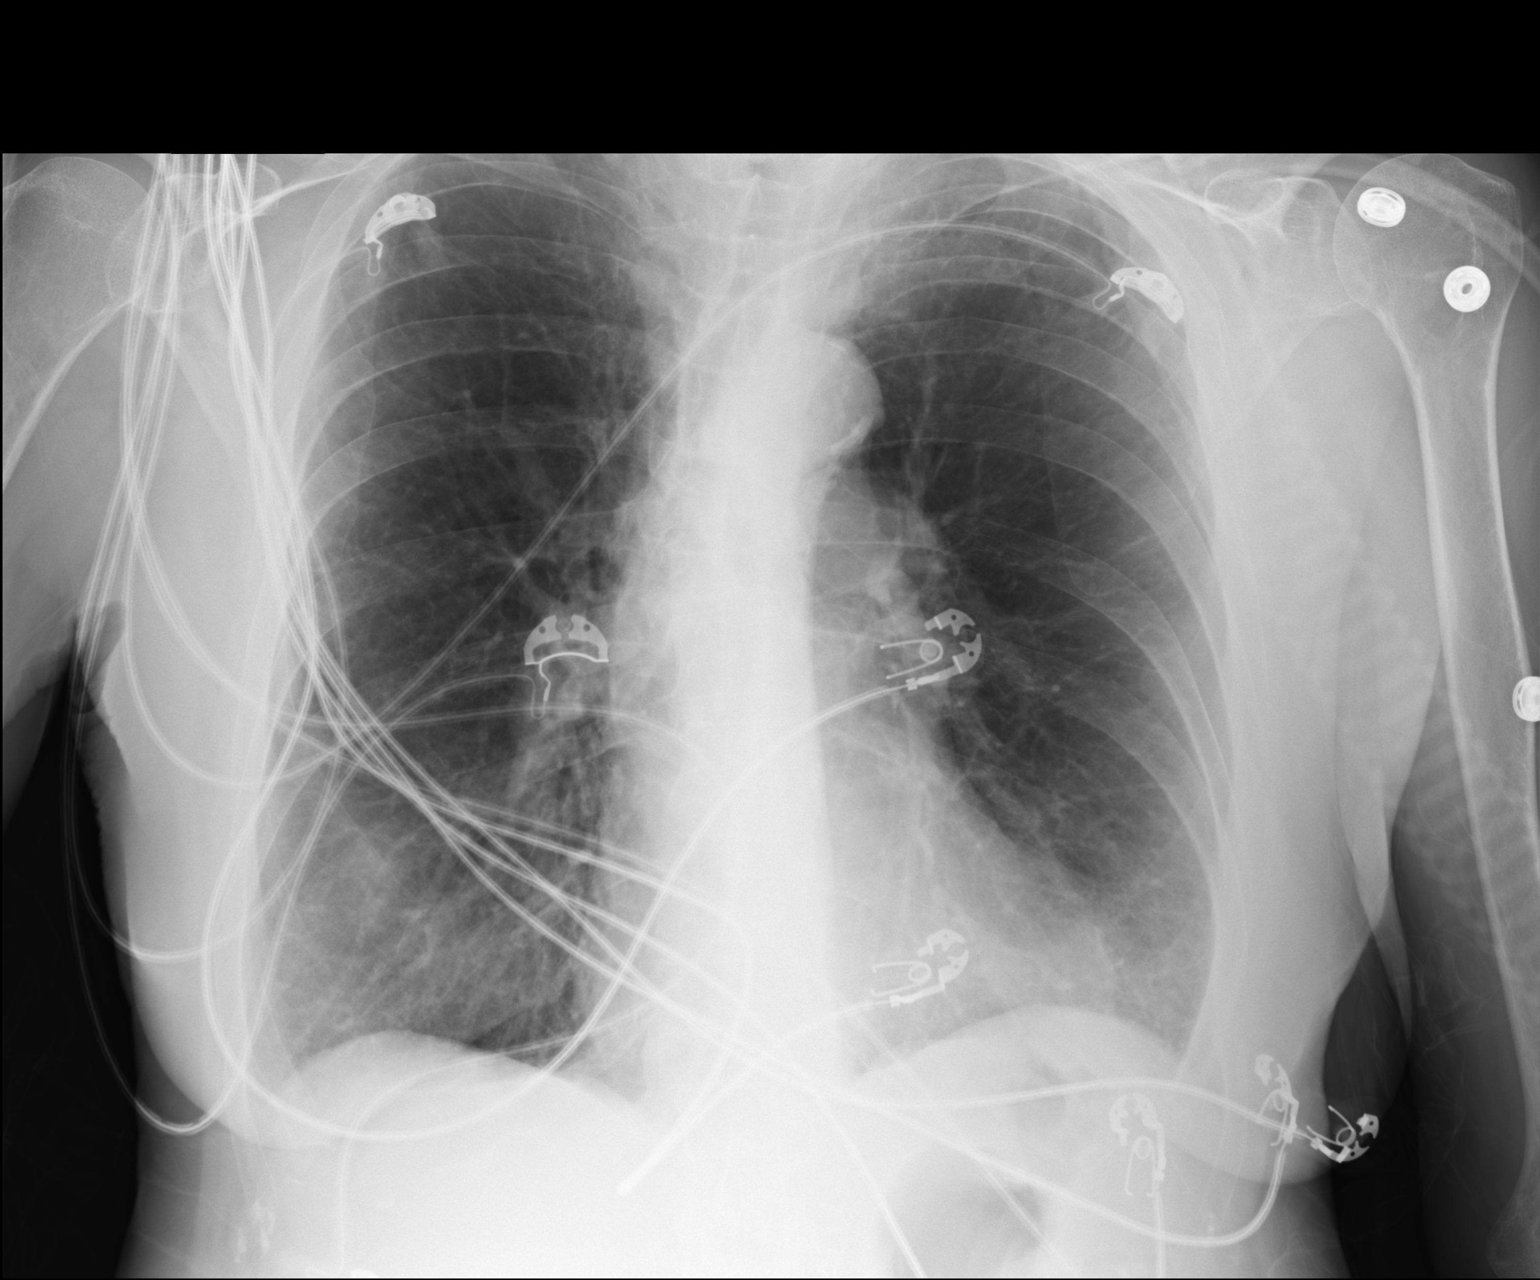

[1 of 1 positions shown; findings below may reference images not displayed]

FINDINGS: Stable cardiac and mediastinal contours. Minimal heterogeneous
opacities bilateral lung bases. No large area of pulmonary
consolidation. No pleural effusion or pneumothorax.
IMPRESSION: Basilar heterogeneous opacities favored represent atelectasis.
